# Patient Record
Sex: Female | Born: 1945
Health system: Southern US, Community
[De-identification: ages and names within clinical notes are randomized; demographics above are authoritative.]

## PROBLEM LIST (undated history)

## (undated) DIAGNOSIS — I10 Essential (primary) hypertension: Secondary | ICD-10-CM

## (undated) DIAGNOSIS — R609 Edema, unspecified: Secondary | ICD-10-CM

## (undated) DIAGNOSIS — M810 Age-related osteoporosis without current pathological fracture: Secondary | ICD-10-CM

## (undated) DIAGNOSIS — R000 Tachycardia, unspecified: Secondary | ICD-10-CM

## (undated) DIAGNOSIS — K5900 Constipation, unspecified: Secondary | ICD-10-CM

## (undated) DIAGNOSIS — E785 Hyperlipidemia, unspecified: Secondary | ICD-10-CM

## (undated) DIAGNOSIS — F329 Major depressive disorder, single episode, unspecified: Secondary | ICD-10-CM

## (undated) DIAGNOSIS — G5620 Lesion of ulnar nerve, unspecified upper limb: Secondary | ICD-10-CM

## (undated) DIAGNOSIS — M25551 Pain in right hip: Secondary | ICD-10-CM

## (undated) DIAGNOSIS — K219 Gastro-esophageal reflux disease without esophagitis: Secondary | ICD-10-CM

## (undated) DIAGNOSIS — F32A Depression, unspecified: Secondary | ICD-10-CM

## (undated) DIAGNOSIS — M109 Gout, unspecified: Secondary | ICD-10-CM

## (undated) DIAGNOSIS — I509 Heart failure, unspecified: Secondary | ICD-10-CM

## (undated) DIAGNOSIS — M25552 Pain in left hip: Secondary | ICD-10-CM

## (undated) DIAGNOSIS — E039 Hypothyroidism, unspecified: Secondary | ICD-10-CM

## (undated) DIAGNOSIS — L409 Psoriasis, unspecified: Secondary | ICD-10-CM

## (undated) DIAGNOSIS — I2699 Other pulmonary embolism without acute cor pulmonale: Secondary | ICD-10-CM

## (undated) DIAGNOSIS — F4321 Adjustment disorder with depressed mood: Secondary | ICD-10-CM

## (undated) DIAGNOSIS — Z87891 Personal history of nicotine dependence: Secondary | ICD-10-CM

## (undated) DIAGNOSIS — J45909 Unspecified asthma, uncomplicated: Secondary | ICD-10-CM

## (undated) HISTORY — DX: Gastro-esophageal reflux disease without esophagitis: K21.9

## (undated) HISTORY — DX: Adjustment disorder with depressed mood: F43.21

## (undated) HISTORY — DX: Tachycardia, unspecified: R00.0

## (undated) HISTORY — DX: Depression, unspecified: F32.A

## (undated) HISTORY — DX: Lesion of ulnar nerve, unspecified upper limb: G56.20

## (undated) HISTORY — DX: Pain in right hip: M25.551

## (undated) HISTORY — DX: Unspecified asthma, uncomplicated: J45.909

## (undated) HISTORY — DX: Pain in left hip: M25.552

## (undated) HISTORY — DX: Essential (primary) hypertension: I10

## (undated) HISTORY — DX: Constipation, unspecified: K59.00

## (undated) HISTORY — PX: NASAL SINUS SURGERY: SHX719

## (undated) HISTORY — DX: Edema, unspecified: R60.9

## (undated) HISTORY — PX: OTHER SURGICAL HISTORY: SHX169

## (undated) HISTORY — DX: Hypothyroidism, unspecified: E03.9

## (undated) HISTORY — DX: Gout, unspecified: M10.9

## (undated) HISTORY — DX: Other pulmonary embolism without acute cor pulmonale: I26.99

## (undated) HISTORY — DX: Hyperlipidemia, unspecified: E78.5

## (undated) HISTORY — DX: Personal history of nicotine dependence: Z87.891

## (undated) HISTORY — DX: Major depressive disorder, single episode, unspecified: F32.9

## (undated) HISTORY — DX: Heart failure, unspecified: I50.9

## (undated) HISTORY — DX: Age-related osteoporosis without current pathological fracture: M81.0

## (undated) HISTORY — DX: Psoriasis, unspecified: L40.9

---

## 2013-01-01 DIAGNOSIS — E785 Hyperlipidemia, unspecified: Secondary | ICD-10-CM | POA: Diagnosis not present

## 2013-01-01 DIAGNOSIS — F329 Major depressive disorder, single episode, unspecified: Secondary | ICD-10-CM | POA: Diagnosis not present

## 2013-01-01 DIAGNOSIS — E039 Hypothyroidism, unspecified: Secondary | ICD-10-CM | POA: Diagnosis not present

## 2013-01-01 DIAGNOSIS — F3289 Other specified depressive episodes: Secondary | ICD-10-CM | POA: Diagnosis not present

## 2013-01-01 DIAGNOSIS — J45909 Unspecified asthma, uncomplicated: Secondary | ICD-10-CM | POA: Diagnosis not present

## 2013-01-01 DIAGNOSIS — K219 Gastro-esophageal reflux disease without esophagitis: Secondary | ICD-10-CM | POA: Diagnosis not present

## 2013-01-01 DIAGNOSIS — Z7901 Long term (current) use of anticoagulants: Secondary | ICD-10-CM | POA: Diagnosis not present

## 2013-01-01 DIAGNOSIS — I10 Essential (primary) hypertension: Secondary | ICD-10-CM | POA: Diagnosis not present

## 2013-01-01 DIAGNOSIS — I82409 Acute embolism and thrombosis of unspecified deep veins of unspecified lower extremity: Secondary | ICD-10-CM | POA: Diagnosis not present

## 2013-01-01 DIAGNOSIS — Z79899 Other long term (current) drug therapy: Secondary | ICD-10-CM | POA: Diagnosis not present

## 2013-01-01 DIAGNOSIS — M810 Age-related osteoporosis without current pathological fracture: Secondary | ICD-10-CM | POA: Diagnosis not present

## 2013-01-01 DIAGNOSIS — R609 Edema, unspecified: Secondary | ICD-10-CM | POA: Diagnosis not present

## 2013-01-01 DIAGNOSIS — R0602 Shortness of breath: Secondary | ICD-10-CM | POA: Diagnosis not present

## 2013-01-04 DIAGNOSIS — H35379 Puckering of macula, unspecified eye: Secondary | ICD-10-CM | POA: Diagnosis not present

## 2013-01-04 DIAGNOSIS — H35359 Cystoid macular degeneration, unspecified eye: Secondary | ICD-10-CM | POA: Diagnosis not present

## 2013-02-02 DIAGNOSIS — I82409 Acute embolism and thrombosis of unspecified deep veins of unspecified lower extremity: Secondary | ICD-10-CM | POA: Diagnosis not present

## 2013-02-17 DIAGNOSIS — I82409 Acute embolism and thrombosis of unspecified deep veins of unspecified lower extremity: Secondary | ICD-10-CM | POA: Diagnosis not present

## 2013-02-25 DIAGNOSIS — J00 Acute nasopharyngitis [common cold]: Secondary | ICD-10-CM | POA: Diagnosis not present

## 2013-02-25 DIAGNOSIS — J209 Acute bronchitis, unspecified: Secondary | ICD-10-CM | POA: Diagnosis not present

## 2013-03-06 DIAGNOSIS — J069 Acute upper respiratory infection, unspecified: Secondary | ICD-10-CM | POA: Diagnosis not present

## 2013-03-06 DIAGNOSIS — J329 Chronic sinusitis, unspecified: Secondary | ICD-10-CM | POA: Diagnosis not present

## 2013-03-06 DIAGNOSIS — R05 Cough: Secondary | ICD-10-CM | POA: Diagnosis not present

## 2013-03-10 DIAGNOSIS — I82409 Acute embolism and thrombosis of unspecified deep veins of unspecified lower extremity: Secondary | ICD-10-CM | POA: Diagnosis not present

## 2013-03-10 DIAGNOSIS — I2699 Other pulmonary embolism without acute cor pulmonale: Secondary | ICD-10-CM | POA: Diagnosis not present

## 2013-03-10 DIAGNOSIS — M79609 Pain in unspecified limb: Secondary | ICD-10-CM | POA: Diagnosis not present

## 2013-03-10 DIAGNOSIS — J45909 Unspecified asthma, uncomplicated: Secondary | ICD-10-CM | POA: Diagnosis not present

## 2013-03-12 DIAGNOSIS — E785 Hyperlipidemia, unspecified: Secondary | ICD-10-CM | POA: Diagnosis not present

## 2013-03-12 DIAGNOSIS — R040 Epistaxis: Secondary | ICD-10-CM | POA: Diagnosis not present

## 2013-03-12 DIAGNOSIS — Z7901 Long term (current) use of anticoagulants: Secondary | ICD-10-CM | POA: Diagnosis not present

## 2013-03-12 DIAGNOSIS — D689 Coagulation defect, unspecified: Secondary | ICD-10-CM | POA: Diagnosis not present

## 2013-03-12 DIAGNOSIS — J329 Chronic sinusitis, unspecified: Secondary | ICD-10-CM | POA: Diagnosis not present

## 2013-03-12 DIAGNOSIS — K219 Gastro-esophageal reflux disease without esophagitis: Secondary | ICD-10-CM | POA: Diagnosis not present

## 2013-03-12 DIAGNOSIS — F329 Major depressive disorder, single episode, unspecified: Secondary | ICD-10-CM | POA: Diagnosis not present

## 2013-03-12 DIAGNOSIS — G44209 Tension-type headache, unspecified, not intractable: Secondary | ICD-10-CM | POA: Diagnosis not present

## 2013-03-12 DIAGNOSIS — Z7982 Long term (current) use of aspirin: Secondary | ICD-10-CM | POA: Diagnosis not present

## 2013-03-12 DIAGNOSIS — R51 Headache: Secondary | ICD-10-CM | POA: Diagnosis not present

## 2013-03-12 DIAGNOSIS — E039 Hypothyroidism, unspecified: Secondary | ICD-10-CM | POA: Diagnosis not present

## 2013-03-12 DIAGNOSIS — J45909 Unspecified asthma, uncomplicated: Secondary | ICD-10-CM | POA: Diagnosis not present

## 2013-03-12 DIAGNOSIS — I10 Essential (primary) hypertension: Secondary | ICD-10-CM | POA: Diagnosis not present

## 2013-03-12 DIAGNOSIS — I509 Heart failure, unspecified: Secondary | ICD-10-CM | POA: Diagnosis not present

## 2013-03-13 DIAGNOSIS — R040 Epistaxis: Secondary | ICD-10-CM | POA: Diagnosis not present

## 2013-03-13 DIAGNOSIS — E785 Hyperlipidemia, unspecified: Secondary | ICD-10-CM | POA: Diagnosis not present

## 2013-03-13 DIAGNOSIS — E039 Hypothyroidism, unspecified: Secondary | ICD-10-CM | POA: Diagnosis not present

## 2013-03-13 DIAGNOSIS — I10 Essential (primary) hypertension: Secondary | ICD-10-CM | POA: Diagnosis not present

## 2013-03-14 DIAGNOSIS — I10 Essential (primary) hypertension: Secondary | ICD-10-CM | POA: Diagnosis not present

## 2013-03-14 DIAGNOSIS — E039 Hypothyroidism, unspecified: Secondary | ICD-10-CM | POA: Diagnosis not present

## 2013-03-14 DIAGNOSIS — E785 Hyperlipidemia, unspecified: Secondary | ICD-10-CM | POA: Diagnosis not present

## 2013-03-14 DIAGNOSIS — R040 Epistaxis: Secondary | ICD-10-CM | POA: Diagnosis not present

## 2013-03-15 DIAGNOSIS — J45909 Unspecified asthma, uncomplicated: Secondary | ICD-10-CM | POA: Diagnosis not present

## 2013-03-15 DIAGNOSIS — R040 Epistaxis: Secondary | ICD-10-CM | POA: Diagnosis not present

## 2013-03-15 DIAGNOSIS — Z79899 Other long term (current) drug therapy: Secondary | ICD-10-CM | POA: Diagnosis not present

## 2013-03-15 DIAGNOSIS — I509 Heart failure, unspecified: Secondary | ICD-10-CM | POA: Diagnosis not present

## 2013-03-15 DIAGNOSIS — I82409 Acute embolism and thrombosis of unspecified deep veins of unspecified lower extremity: Secondary | ICD-10-CM | POA: Diagnosis not present

## 2013-03-15 DIAGNOSIS — I10 Essential (primary) hypertension: Secondary | ICD-10-CM | POA: Diagnosis not present

## 2013-03-16 DIAGNOSIS — R040 Epistaxis: Secondary | ICD-10-CM | POA: Diagnosis not present

## 2013-03-16 DIAGNOSIS — D689 Coagulation defect, unspecified: Secondary | ICD-10-CM | POA: Diagnosis not present

## 2013-03-16 DIAGNOSIS — J329 Chronic sinusitis, unspecified: Secondary | ICD-10-CM | POA: Diagnosis not present

## 2013-03-16 DIAGNOSIS — J310 Chronic rhinitis: Secondary | ICD-10-CM | POA: Diagnosis not present

## 2013-03-17 DIAGNOSIS — R040 Epistaxis: Secondary | ICD-10-CM | POA: Diagnosis not present

## 2013-03-23 DIAGNOSIS — R040 Epistaxis: Secondary | ICD-10-CM | POA: Diagnosis not present

## 2013-03-23 DIAGNOSIS — J342 Deviated nasal septum: Secondary | ICD-10-CM | POA: Diagnosis not present

## 2013-03-23 DIAGNOSIS — J329 Chronic sinusitis, unspecified: Secondary | ICD-10-CM | POA: Diagnosis not present

## 2013-03-23 DIAGNOSIS — D689 Coagulation defect, unspecified: Secondary | ICD-10-CM | POA: Diagnosis not present

## 2013-03-24 DIAGNOSIS — J329 Chronic sinusitis, unspecified: Secondary | ICD-10-CM | POA: Diagnosis not present

## 2013-03-24 DIAGNOSIS — J320 Chronic maxillary sinusitis: Secondary | ICD-10-CM | POA: Diagnosis not present

## 2013-03-31 DIAGNOSIS — R040 Epistaxis: Secondary | ICD-10-CM | POA: Diagnosis not present

## 2013-03-31 DIAGNOSIS — J320 Chronic maxillary sinusitis: Secondary | ICD-10-CM | POA: Diagnosis not present

## 2013-04-01 DIAGNOSIS — Z7901 Long term (current) use of anticoagulants: Secondary | ICD-10-CM | POA: Diagnosis not present

## 2013-04-01 DIAGNOSIS — J329 Chronic sinusitis, unspecified: Secondary | ICD-10-CM | POA: Diagnosis not present

## 2013-04-01 DIAGNOSIS — K449 Diaphragmatic hernia without obstruction or gangrene: Secondary | ICD-10-CM | POA: Diagnosis not present

## 2013-04-01 DIAGNOSIS — R05 Cough: Secondary | ICD-10-CM | POA: Diagnosis not present

## 2013-04-01 DIAGNOSIS — I2699 Other pulmonary embolism without acute cor pulmonale: Secondary | ICD-10-CM | POA: Diagnosis not present

## 2013-04-01 DIAGNOSIS — R040 Epistaxis: Secondary | ICD-10-CM | POA: Diagnosis not present

## 2013-04-01 DIAGNOSIS — Z79899 Other long term (current) drug therapy: Secondary | ICD-10-CM | POA: Diagnosis not present

## 2013-04-19 DIAGNOSIS — J329 Chronic sinusitis, unspecified: Secondary | ICD-10-CM | POA: Diagnosis not present

## 2013-04-19 DIAGNOSIS — I509 Heart failure, unspecified: Secondary | ICD-10-CM | POA: Diagnosis not present

## 2013-04-19 DIAGNOSIS — Z79899 Other long term (current) drug therapy: Secondary | ICD-10-CM | POA: Diagnosis not present

## 2013-04-19 DIAGNOSIS — Z01818 Encounter for other preprocedural examination: Secondary | ICD-10-CM | POA: Diagnosis not present

## 2013-04-29 DIAGNOSIS — J329 Chronic sinusitis, unspecified: Secondary | ICD-10-CM | POA: Diagnosis not present

## 2013-04-29 DIAGNOSIS — Z79899 Other long term (current) drug therapy: Secondary | ICD-10-CM | POA: Diagnosis not present

## 2013-04-29 DIAGNOSIS — I509 Heart failure, unspecified: Secondary | ICD-10-CM | POA: Diagnosis not present

## 2013-04-29 DIAGNOSIS — J310 Chronic rhinitis: Secondary | ICD-10-CM | POA: Diagnosis not present

## 2013-05-10 DIAGNOSIS — J329 Chronic sinusitis, unspecified: Secondary | ICD-10-CM | POA: Diagnosis not present

## 2013-05-18 DIAGNOSIS — J329 Chronic sinusitis, unspecified: Secondary | ICD-10-CM | POA: Diagnosis not present

## 2013-05-18 DIAGNOSIS — J310 Chronic rhinitis: Secondary | ICD-10-CM | POA: Diagnosis not present

## 2013-05-18 DIAGNOSIS — J0120 Acute ethmoidal sinusitis, unspecified: Secondary | ICD-10-CM | POA: Diagnosis not present

## 2013-05-18 DIAGNOSIS — J0100 Acute maxillary sinusitis, unspecified: Secondary | ICD-10-CM | POA: Diagnosis not present

## 2013-06-07 DIAGNOSIS — I2699 Other pulmonary embolism without acute cor pulmonale: Secondary | ICD-10-CM | POA: Diagnosis not present

## 2013-06-07 DIAGNOSIS — K219 Gastro-esophageal reflux disease without esophagitis: Secondary | ICD-10-CM | POA: Diagnosis not present

## 2013-06-07 DIAGNOSIS — E039 Hypothyroidism, unspecified: Secondary | ICD-10-CM | POA: Diagnosis not present

## 2013-06-07 DIAGNOSIS — I82409 Acute embolism and thrombosis of unspecified deep veins of unspecified lower extremity: Secondary | ICD-10-CM | POA: Diagnosis not present

## 2013-06-07 DIAGNOSIS — F329 Major depressive disorder, single episode, unspecified: Secondary | ICD-10-CM | POA: Diagnosis not present

## 2013-06-07 DIAGNOSIS — I10 Essential (primary) hypertension: Secondary | ICD-10-CM | POA: Diagnosis not present

## 2013-06-07 DIAGNOSIS — G4700 Insomnia, unspecified: Secondary | ICD-10-CM | POA: Diagnosis not present

## 2013-06-10 DIAGNOSIS — I2699 Other pulmonary embolism without acute cor pulmonale: Secondary | ICD-10-CM | POA: Diagnosis not present

## 2013-06-10 DIAGNOSIS — I82409 Acute embolism and thrombosis of unspecified deep veins of unspecified lower extremity: Secondary | ICD-10-CM | POA: Diagnosis not present

## 2013-06-14 DIAGNOSIS — Z0389 Encounter for observation for other suspected diseases and conditions ruled out: Secondary | ICD-10-CM | POA: Diagnosis not present

## 2013-06-14 DIAGNOSIS — I82409 Acute embolism and thrombosis of unspecified deep veins of unspecified lower extremity: Secondary | ICD-10-CM | POA: Diagnosis not present

## 2013-06-21 DIAGNOSIS — I10 Essential (primary) hypertension: Secondary | ICD-10-CM | POA: Diagnosis not present

## 2013-06-21 DIAGNOSIS — E039 Hypothyroidism, unspecified: Secondary | ICD-10-CM | POA: Diagnosis not present

## 2013-06-21 DIAGNOSIS — M5137 Other intervertebral disc degeneration, lumbosacral region: Secondary | ICD-10-CM | POA: Diagnosis not present

## 2013-06-21 DIAGNOSIS — M1710 Unilateral primary osteoarthritis, unspecified knee: Secondary | ICD-10-CM | POA: Diagnosis not present

## 2013-06-21 DIAGNOSIS — M25569 Pain in unspecified knee: Secondary | ICD-10-CM | POA: Diagnosis not present

## 2013-06-21 DIAGNOSIS — M47817 Spondylosis without myelopathy or radiculopathy, lumbosacral region: Secondary | ICD-10-CM | POA: Diagnosis not present

## 2013-06-21 DIAGNOSIS — M79609 Pain in unspecified limb: Secondary | ICD-10-CM | POA: Diagnosis not present

## 2013-06-21 DIAGNOSIS — K219 Gastro-esophageal reflux disease without esophagitis: Secondary | ICD-10-CM | POA: Diagnosis not present

## 2013-06-21 DIAGNOSIS — M545 Low back pain: Secondary | ICD-10-CM | POA: Diagnosis not present

## 2013-06-21 DIAGNOSIS — M25559 Pain in unspecified hip: Secondary | ICD-10-CM | POA: Diagnosis not present

## 2013-06-21 DIAGNOSIS — F329 Major depressive disorder, single episode, unspecified: Secondary | ICD-10-CM | POA: Diagnosis not present

## 2013-06-21 DIAGNOSIS — IMO0002 Reserved for concepts with insufficient information to code with codable children: Secondary | ICD-10-CM | POA: Diagnosis not present

## 2013-06-23 DIAGNOSIS — I2699 Other pulmonary embolism without acute cor pulmonale: Secondary | ICD-10-CM | POA: Diagnosis not present

## 2013-06-24 DIAGNOSIS — M2550 Pain in unspecified joint: Secondary | ICD-10-CM | POA: Diagnosis not present

## 2013-06-24 DIAGNOSIS — K117 Disturbances of salivary secretion: Secondary | ICD-10-CM | POA: Diagnosis not present

## 2013-06-24 DIAGNOSIS — R5383 Other fatigue: Secondary | ICD-10-CM | POA: Diagnosis not present

## 2013-06-24 DIAGNOSIS — IMO0001 Reserved for inherently not codable concepts without codable children: Secondary | ICD-10-CM | POA: Diagnosis not present

## 2013-06-24 DIAGNOSIS — R5381 Other malaise: Secondary | ICD-10-CM | POA: Diagnosis not present

## 2013-07-01 DIAGNOSIS — I739 Peripheral vascular disease, unspecified: Secondary | ICD-10-CM | POA: Diagnosis not present

## 2013-07-01 DIAGNOSIS — I743 Embolism and thrombosis of arteries of the lower extremities: Secondary | ICD-10-CM | POA: Diagnosis not present

## 2013-07-05 DIAGNOSIS — H35379 Puckering of macula, unspecified eye: Secondary | ICD-10-CM | POA: Diagnosis not present

## 2013-07-05 DIAGNOSIS — H35359 Cystoid macular degeneration, unspecified eye: Secondary | ICD-10-CM | POA: Diagnosis not present

## 2013-07-16 DIAGNOSIS — M2550 Pain in unspecified joint: Secondary | ICD-10-CM | POA: Diagnosis not present

## 2013-07-16 DIAGNOSIS — IMO0001 Reserved for inherently not codable concepts without codable children: Secondary | ICD-10-CM | POA: Diagnosis not present

## 2013-07-16 DIAGNOSIS — R5381 Other malaise: Secondary | ICD-10-CM | POA: Diagnosis not present

## 2013-07-16 DIAGNOSIS — R700 Elevated erythrocyte sedimentation rate: Secondary | ICD-10-CM | POA: Diagnosis not present

## 2013-07-21 DIAGNOSIS — M2550 Pain in unspecified joint: Secondary | ICD-10-CM | POA: Diagnosis not present

## 2013-07-21 DIAGNOSIS — IMO0001 Reserved for inherently not codable concepts without codable children: Secondary | ICD-10-CM | POA: Diagnosis not present

## 2013-07-21 DIAGNOSIS — I2699 Other pulmonary embolism without acute cor pulmonale: Secondary | ICD-10-CM | POA: Diagnosis not present

## 2013-07-21 DIAGNOSIS — R700 Elevated erythrocyte sedimentation rate: Secondary | ICD-10-CM | POA: Diagnosis not present

## 2013-08-02 DIAGNOSIS — I10 Essential (primary) hypertension: Secondary | ICD-10-CM | POA: Diagnosis not present

## 2013-08-02 DIAGNOSIS — E039 Hypothyroidism, unspecified: Secondary | ICD-10-CM | POA: Diagnosis not present

## 2013-08-02 DIAGNOSIS — F329 Major depressive disorder, single episode, unspecified: Secondary | ICD-10-CM | POA: Diagnosis not present

## 2013-08-02 DIAGNOSIS — K219 Gastro-esophageal reflux disease without esophagitis: Secondary | ICD-10-CM | POA: Diagnosis not present

## 2013-08-02 DIAGNOSIS — Z79899 Other long term (current) drug therapy: Secondary | ICD-10-CM | POA: Diagnosis not present

## 2013-08-02 DIAGNOSIS — E785 Hyperlipidemia, unspecified: Secondary | ICD-10-CM | POA: Diagnosis not present

## 2013-08-02 DIAGNOSIS — I82409 Acute embolism and thrombosis of unspecified deep veins of unspecified lower extremity: Secondary | ICD-10-CM | POA: Diagnosis not present

## 2013-08-02 DIAGNOSIS — I509 Heart failure, unspecified: Secondary | ICD-10-CM | POA: Diagnosis not present

## 2013-08-16 DIAGNOSIS — E119 Type 2 diabetes mellitus without complications: Secondary | ICD-10-CM | POA: Diagnosis not present

## 2013-08-16 DIAGNOSIS — Z79899 Other long term (current) drug therapy: Secondary | ICD-10-CM | POA: Diagnosis not present

## 2013-08-16 DIAGNOSIS — E039 Hypothyroidism, unspecified: Secondary | ICD-10-CM | POA: Diagnosis not present

## 2013-08-16 DIAGNOSIS — I10 Essential (primary) hypertension: Secondary | ICD-10-CM | POA: Diagnosis not present

## 2013-08-16 DIAGNOSIS — I82409 Acute embolism and thrombosis of unspecified deep veins of unspecified lower extremity: Secondary | ICD-10-CM | POA: Diagnosis not present

## 2013-08-16 DIAGNOSIS — E559 Vitamin D deficiency, unspecified: Secondary | ICD-10-CM | POA: Diagnosis not present

## 2013-08-16 DIAGNOSIS — D509 Iron deficiency anemia, unspecified: Secondary | ICD-10-CM | POA: Diagnosis not present

## 2013-08-16 DIAGNOSIS — E782 Mixed hyperlipidemia: Secondary | ICD-10-CM | POA: Diagnosis not present

## 2013-08-16 DIAGNOSIS — M25569 Pain in unspecified knee: Secondary | ICD-10-CM | POA: Diagnosis not present

## 2013-08-24 DIAGNOSIS — L218 Other seborrheic dermatitis: Secondary | ICD-10-CM | POA: Diagnosis not present

## 2013-08-24 DIAGNOSIS — R609 Edema, unspecified: Secondary | ICD-10-CM | POA: Diagnosis not present

## 2013-08-24 DIAGNOSIS — I509 Heart failure, unspecified: Secondary | ICD-10-CM | POA: Diagnosis not present

## 2013-08-31 DIAGNOSIS — I509 Heart failure, unspecified: Secondary | ICD-10-CM | POA: Diagnosis not present

## 2013-08-31 DIAGNOSIS — Z7901 Long term (current) use of anticoagulants: Secondary | ICD-10-CM | POA: Diagnosis not present

## 2013-09-06 DIAGNOSIS — L570 Actinic keratosis: Secondary | ICD-10-CM | POA: Diagnosis not present

## 2013-09-06 DIAGNOSIS — L578 Other skin changes due to chronic exposure to nonionizing radiation: Secondary | ICD-10-CM | POA: Diagnosis not present

## 2013-09-25 DIAGNOSIS — R040 Epistaxis: Secondary | ICD-10-CM | POA: Diagnosis not present

## 2013-09-25 DIAGNOSIS — E7800 Pure hypercholesterolemia, unspecified: Secondary | ICD-10-CM | POA: Diagnosis not present

## 2013-09-25 DIAGNOSIS — Z79899 Other long term (current) drug therapy: Secondary | ICD-10-CM | POA: Diagnosis not present

## 2013-09-25 DIAGNOSIS — J45909 Unspecified asthma, uncomplicated: Secondary | ICD-10-CM | POA: Diagnosis not present

## 2013-09-25 DIAGNOSIS — D689 Coagulation defect, unspecified: Secondary | ICD-10-CM | POA: Diagnosis not present

## 2013-09-25 DIAGNOSIS — Z7901 Long term (current) use of anticoagulants: Secondary | ICD-10-CM | POA: Diagnosis not present

## 2013-09-25 DIAGNOSIS — I10 Essential (primary) hypertension: Secondary | ICD-10-CM | POA: Diagnosis not present

## 2013-09-25 DIAGNOSIS — K219 Gastro-esophageal reflux disease without esophagitis: Secondary | ICD-10-CM | POA: Diagnosis not present

## 2013-09-30 DIAGNOSIS — M25569 Pain in unspecified knee: Secondary | ICD-10-CM | POA: Diagnosis not present

## 2013-09-30 DIAGNOSIS — Z7901 Long term (current) use of anticoagulants: Secondary | ICD-10-CM | POA: Diagnosis not present

## 2013-09-30 DIAGNOSIS — Z23 Encounter for immunization: Secondary | ICD-10-CM | POA: Diagnosis not present

## 2013-09-30 DIAGNOSIS — I82409 Acute embolism and thrombosis of unspecified deep veins of unspecified lower extremity: Secondary | ICD-10-CM | POA: Diagnosis not present

## 2013-09-30 DIAGNOSIS — E039 Hypothyroidism, unspecified: Secondary | ICD-10-CM | POA: Diagnosis not present

## 2013-09-30 DIAGNOSIS — N19 Unspecified kidney failure: Secondary | ICD-10-CM | POA: Diagnosis not present

## 2013-10-07 DIAGNOSIS — Z7901 Long term (current) use of anticoagulants: Secondary | ICD-10-CM | POA: Diagnosis not present

## 2013-10-07 DIAGNOSIS — I82409 Acute embolism and thrombosis of unspecified deep veins of unspecified lower extremity: Secondary | ICD-10-CM | POA: Diagnosis not present

## 2013-10-13 DIAGNOSIS — I82409 Acute embolism and thrombosis of unspecified deep veins of unspecified lower extremity: Secondary | ICD-10-CM | POA: Diagnosis not present

## 2013-10-13 DIAGNOSIS — Z7901 Long term (current) use of anticoagulants: Secondary | ICD-10-CM | POA: Diagnosis not present

## 2013-10-18 DIAGNOSIS — L821 Other seborrheic keratosis: Secondary | ICD-10-CM | POA: Diagnosis not present

## 2013-10-18 DIAGNOSIS — L570 Actinic keratosis: Secondary | ICD-10-CM | POA: Diagnosis not present

## 2013-10-18 DIAGNOSIS — L820 Inflamed seborrheic keratosis: Secondary | ICD-10-CM | POA: Diagnosis not present

## 2013-10-27 DIAGNOSIS — Z7901 Long term (current) use of anticoagulants: Secondary | ICD-10-CM | POA: Diagnosis not present

## 2013-10-27 DIAGNOSIS — I82409 Acute embolism and thrombosis of unspecified deep veins of unspecified lower extremity: Secondary | ICD-10-CM | POA: Diagnosis not present

## 2013-11-09 DIAGNOSIS — I2699 Other pulmonary embolism without acute cor pulmonale: Secondary | ICD-10-CM | POA: Diagnosis not present

## 2013-11-09 DIAGNOSIS — I82409 Acute embolism and thrombosis of unspecified deep veins of unspecified lower extremity: Secondary | ICD-10-CM | POA: Diagnosis not present

## 2013-12-01 DIAGNOSIS — I82409 Acute embolism and thrombosis of unspecified deep veins of unspecified lower extremity: Secondary | ICD-10-CM | POA: Diagnosis not present

## 2013-12-01 DIAGNOSIS — I2699 Other pulmonary embolism without acute cor pulmonale: Secondary | ICD-10-CM | POA: Diagnosis not present

## 2013-12-13 DIAGNOSIS — Z7901 Long term (current) use of anticoagulants: Secondary | ICD-10-CM | POA: Diagnosis not present

## 2013-12-13 DIAGNOSIS — I82409 Acute embolism and thrombosis of unspecified deep veins of unspecified lower extremity: Secondary | ICD-10-CM | POA: Diagnosis not present

## 2013-12-13 DIAGNOSIS — J209 Acute bronchitis, unspecified: Secondary | ICD-10-CM | POA: Diagnosis not present

## 2014-01-04 DIAGNOSIS — I82409 Acute embolism and thrombosis of unspecified deep veins of unspecified lower extremity: Secondary | ICD-10-CM | POA: Diagnosis not present

## 2014-01-04 DIAGNOSIS — I2699 Other pulmonary embolism without acute cor pulmonale: Secondary | ICD-10-CM | POA: Diagnosis not present

## 2014-02-01 DIAGNOSIS — I82409 Acute embolism and thrombosis of unspecified deep veins of unspecified lower extremity: Secondary | ICD-10-CM | POA: Diagnosis not present

## 2014-02-01 DIAGNOSIS — I2699 Other pulmonary embolism without acute cor pulmonale: Secondary | ICD-10-CM | POA: Diagnosis not present

## 2014-02-18 DIAGNOSIS — L219 Seborrheic dermatitis, unspecified: Secondary | ICD-10-CM | POA: Diagnosis not present

## 2014-02-18 DIAGNOSIS — L659 Nonscarring hair loss, unspecified: Secondary | ICD-10-CM | POA: Diagnosis not present

## 2014-02-18 DIAGNOSIS — L570 Actinic keratosis: Secondary | ICD-10-CM | POA: Diagnosis not present

## 2014-03-01 DIAGNOSIS — I82409 Acute embolism and thrombosis of unspecified deep veins of unspecified lower extremity: Secondary | ICD-10-CM | POA: Diagnosis not present

## 2014-03-01 DIAGNOSIS — I2699 Other pulmonary embolism without acute cor pulmonale: Secondary | ICD-10-CM | POA: Diagnosis not present

## 2014-03-18 DIAGNOSIS — Z7901 Long term (current) use of anticoagulants: Secondary | ICD-10-CM | POA: Diagnosis not present

## 2014-03-18 DIAGNOSIS — F411 Generalized anxiety disorder: Secondary | ICD-10-CM | POA: Diagnosis not present

## 2014-03-18 DIAGNOSIS — I10 Essential (primary) hypertension: Secondary | ICD-10-CM | POA: Diagnosis not present

## 2014-03-18 DIAGNOSIS — I82409 Acute embolism and thrombosis of unspecified deep veins of unspecified lower extremity: Secondary | ICD-10-CM | POA: Diagnosis not present

## 2014-03-24 DIAGNOSIS — Z1231 Encounter for screening mammogram for malignant neoplasm of breast: Secondary | ICD-10-CM | POA: Diagnosis not present

## 2014-03-31 DIAGNOSIS — I82409 Acute embolism and thrombosis of unspecified deep veins of unspecified lower extremity: Secondary | ICD-10-CM | POA: Diagnosis not present

## 2014-03-31 DIAGNOSIS — I2699 Other pulmonary embolism without acute cor pulmonale: Secondary | ICD-10-CM | POA: Diagnosis not present

## 2014-04-27 DIAGNOSIS — I82409 Acute embolism and thrombosis of unspecified deep veins of unspecified lower extremity: Secondary | ICD-10-CM | POA: Diagnosis not present

## 2014-04-27 DIAGNOSIS — I2699 Other pulmonary embolism without acute cor pulmonale: Secondary | ICD-10-CM | POA: Diagnosis not present

## 2014-05-25 DIAGNOSIS — I82409 Acute embolism and thrombosis of unspecified deep veins of unspecified lower extremity: Secondary | ICD-10-CM | POA: Diagnosis not present

## 2014-05-25 DIAGNOSIS — I2699 Other pulmonary embolism without acute cor pulmonale: Secondary | ICD-10-CM | POA: Diagnosis not present

## 2014-06-20 DIAGNOSIS — L821 Other seborrheic keratosis: Secondary | ICD-10-CM | POA: Diagnosis not present

## 2014-06-20 DIAGNOSIS — L570 Actinic keratosis: Secondary | ICD-10-CM | POA: Diagnosis not present

## 2014-06-20 DIAGNOSIS — L820 Inflamed seborrheic keratosis: Secondary | ICD-10-CM | POA: Diagnosis not present

## 2014-06-21 DIAGNOSIS — I82409 Acute embolism and thrombosis of unspecified deep veins of unspecified lower extremity: Secondary | ICD-10-CM | POA: Diagnosis not present

## 2014-06-21 DIAGNOSIS — I2699 Other pulmonary embolism without acute cor pulmonale: Secondary | ICD-10-CM | POA: Diagnosis not present

## 2014-07-18 DIAGNOSIS — E782 Mixed hyperlipidemia: Secondary | ICD-10-CM | POA: Diagnosis not present

## 2014-07-18 DIAGNOSIS — M2550 Pain in unspecified joint: Secondary | ICD-10-CM | POA: Diagnosis not present

## 2014-07-18 DIAGNOSIS — E559 Vitamin D deficiency, unspecified: Secondary | ICD-10-CM | POA: Diagnosis not present

## 2014-07-18 DIAGNOSIS — Z7901 Long term (current) use of anticoagulants: Secondary | ICD-10-CM | POA: Diagnosis not present

## 2014-07-18 DIAGNOSIS — F411 Generalized anxiety disorder: Secondary | ICD-10-CM | POA: Diagnosis not present

## 2014-07-18 DIAGNOSIS — L408 Other psoriasis: Secondary | ICD-10-CM | POA: Diagnosis not present

## 2014-07-18 DIAGNOSIS — E119 Type 2 diabetes mellitus without complications: Secondary | ICD-10-CM | POA: Diagnosis not present

## 2014-07-18 DIAGNOSIS — I10 Essential (primary) hypertension: Secondary | ICD-10-CM | POA: Diagnosis not present

## 2014-07-18 DIAGNOSIS — I82409 Acute embolism and thrombosis of unspecified deep veins of unspecified lower extremity: Secondary | ICD-10-CM | POA: Diagnosis not present

## 2014-07-18 DIAGNOSIS — E039 Hypothyroidism, unspecified: Secondary | ICD-10-CM | POA: Diagnosis not present

## 2014-07-20 DIAGNOSIS — I82409 Acute embolism and thrombosis of unspecified deep veins of unspecified lower extremity: Secondary | ICD-10-CM | POA: Diagnosis not present

## 2014-07-20 DIAGNOSIS — I2699 Other pulmonary embolism without acute cor pulmonale: Secondary | ICD-10-CM | POA: Diagnosis not present

## 2014-08-15 DIAGNOSIS — Z79899 Other long term (current) drug therapy: Secondary | ICD-10-CM | POA: Diagnosis not present

## 2014-08-15 DIAGNOSIS — M545 Low back pain, unspecified: Secondary | ICD-10-CM | POA: Diagnosis not present

## 2014-08-16 DIAGNOSIS — M79609 Pain in unspecified limb: Secondary | ICD-10-CM | POA: Diagnosis not present

## 2014-08-16 DIAGNOSIS — I10 Essential (primary) hypertension: Secondary | ICD-10-CM | POA: Diagnosis not present

## 2014-08-16 DIAGNOSIS — M25579 Pain in unspecified ankle and joints of unspecified foot: Secondary | ICD-10-CM | POA: Diagnosis not present

## 2014-08-19 DIAGNOSIS — I2699 Other pulmonary embolism without acute cor pulmonale: Secondary | ICD-10-CM | POA: Diagnosis not present

## 2014-08-19 DIAGNOSIS — I82409 Acute embolism and thrombosis of unspecified deep veins of unspecified lower extremity: Secondary | ICD-10-CM | POA: Diagnosis not present

## 2014-08-25 DIAGNOSIS — M659 Synovitis and tenosynovitis, unspecified: Secondary | ICD-10-CM | POA: Diagnosis not present

## 2014-08-25 DIAGNOSIS — M7750 Other enthesopathy of unspecified foot: Secondary | ICD-10-CM | POA: Diagnosis not present

## 2014-09-15 DIAGNOSIS — M659 Synovitis and tenosynovitis, unspecified: Secondary | ICD-10-CM | POA: Diagnosis not present

## 2014-09-15 DIAGNOSIS — M7750 Other enthesopathy of unspecified foot: Secondary | ICD-10-CM | POA: Diagnosis not present

## 2014-09-17 DIAGNOSIS — I2699 Other pulmonary embolism without acute cor pulmonale: Secondary | ICD-10-CM | POA: Diagnosis not present

## 2014-09-17 DIAGNOSIS — I82409 Acute embolism and thrombosis of unspecified deep veins of unspecified lower extremity: Secondary | ICD-10-CM | POA: Diagnosis not present

## 2014-10-12 DIAGNOSIS — R252 Cramp and spasm: Secondary | ICD-10-CM | POA: Diagnosis not present

## 2014-10-12 DIAGNOSIS — L4050 Arthropathic psoriasis, unspecified: Secondary | ICD-10-CM | POA: Diagnosis not present

## 2014-10-12 DIAGNOSIS — I82419 Acute embolism and thrombosis of unspecified femoral vein: Secondary | ICD-10-CM | POA: Diagnosis not present

## 2014-10-12 DIAGNOSIS — E782 Mixed hyperlipidemia: Secondary | ICD-10-CM | POA: Diagnosis not present

## 2014-10-12 DIAGNOSIS — Z7901 Long term (current) use of anticoagulants: Secondary | ICD-10-CM | POA: Diagnosis not present

## 2014-10-12 DIAGNOSIS — E039 Hypothyroidism, unspecified: Secondary | ICD-10-CM | POA: Diagnosis not present

## 2014-10-12 DIAGNOSIS — E559 Vitamin D deficiency, unspecified: Secondary | ICD-10-CM | POA: Diagnosis not present

## 2014-10-12 DIAGNOSIS — E119 Type 2 diabetes mellitus without complications: Secondary | ICD-10-CM | POA: Diagnosis not present

## 2014-10-12 DIAGNOSIS — K219 Gastro-esophageal reflux disease without esophagitis: Secondary | ICD-10-CM | POA: Diagnosis not present

## 2014-10-12 DIAGNOSIS — Z23 Encounter for immunization: Secondary | ICD-10-CM | POA: Diagnosis not present

## 2014-10-12 DIAGNOSIS — I10 Essential (primary) hypertension: Secondary | ICD-10-CM | POA: Diagnosis not present

## 2014-10-12 DIAGNOSIS — F411 Generalized anxiety disorder: Secondary | ICD-10-CM | POA: Diagnosis not present

## 2014-10-12 DIAGNOSIS — M2550 Pain in unspecified joint: Secondary | ICD-10-CM | POA: Diagnosis not present

## 2014-10-17 DIAGNOSIS — I82409 Acute embolism and thrombosis of unspecified deep veins of unspecified lower extremity: Secondary | ICD-10-CM | POA: Diagnosis not present

## 2014-10-17 DIAGNOSIS — I2699 Other pulmonary embolism without acute cor pulmonale: Secondary | ICD-10-CM | POA: Diagnosis not present

## 2014-10-20 DIAGNOSIS — R351 Nocturia: Secondary | ICD-10-CM | POA: Diagnosis not present

## 2014-10-20 DIAGNOSIS — N3281 Overactive bladder: Secondary | ICD-10-CM | POA: Diagnosis not present

## 2014-10-20 DIAGNOSIS — R3912 Poor urinary stream: Secondary | ICD-10-CM | POA: Diagnosis not present

## 2014-10-20 DIAGNOSIS — N318 Other neuromuscular dysfunction of bladder: Secondary | ICD-10-CM | POA: Diagnosis not present

## 2014-10-20 DIAGNOSIS — R3914 Feeling of incomplete bladder emptying: Secondary | ICD-10-CM | POA: Diagnosis not present

## 2014-10-20 DIAGNOSIS — N3090 Cystitis, unspecified without hematuria: Secondary | ICD-10-CM | POA: Diagnosis not present

## 2014-11-04 DIAGNOSIS — N3281 Overactive bladder: Secondary | ICD-10-CM | POA: Diagnosis not present

## 2014-11-04 DIAGNOSIS — N318 Other neuromuscular dysfunction of bladder: Secondary | ICD-10-CM | POA: Diagnosis not present

## 2014-11-04 DIAGNOSIS — R351 Nocturia: Secondary | ICD-10-CM | POA: Diagnosis not present

## 2014-11-04 DIAGNOSIS — N3090 Cystitis, unspecified without hematuria: Secondary | ICD-10-CM | POA: Diagnosis not present

## 2014-11-04 DIAGNOSIS — R3914 Feeling of incomplete bladder emptying: Secondary | ICD-10-CM | POA: Diagnosis not present

## 2014-11-13 DIAGNOSIS — I2699 Other pulmonary embolism without acute cor pulmonale: Secondary | ICD-10-CM | POA: Diagnosis not present

## 2014-11-13 DIAGNOSIS — I82409 Acute embolism and thrombosis of unspecified deep veins of unspecified lower extremity: Secondary | ICD-10-CM | POA: Diagnosis not present

## 2014-12-12 DIAGNOSIS — I2699 Other pulmonary embolism without acute cor pulmonale: Secondary | ICD-10-CM | POA: Diagnosis not present

## 2014-12-12 DIAGNOSIS — I82409 Acute embolism and thrombosis of unspecified deep veins of unspecified lower extremity: Secondary | ICD-10-CM | POA: Diagnosis not present

## 2015-01-10 DIAGNOSIS — I2699 Other pulmonary embolism without acute cor pulmonale: Secondary | ICD-10-CM | POA: Diagnosis not present

## 2015-02-07 DIAGNOSIS — I2699 Other pulmonary embolism without acute cor pulmonale: Secondary | ICD-10-CM | POA: Diagnosis not present

## 2015-02-16 DIAGNOSIS — E119 Type 2 diabetes mellitus without complications: Secondary | ICD-10-CM | POA: Diagnosis not present

## 2015-02-16 DIAGNOSIS — Z7901 Long term (current) use of anticoagulants: Secondary | ICD-10-CM | POA: Diagnosis not present

## 2015-02-16 DIAGNOSIS — I10 Essential (primary) hypertension: Secondary | ICD-10-CM | POA: Diagnosis not present

## 2015-02-16 DIAGNOSIS — E559 Vitamin D deficiency, unspecified: Secondary | ICD-10-CM | POA: Diagnosis not present

## 2015-02-16 DIAGNOSIS — M2550 Pain in unspecified joint: Secondary | ICD-10-CM | POA: Diagnosis not present

## 2015-02-16 DIAGNOSIS — K219 Gastro-esophageal reflux disease without esophagitis: Secondary | ICD-10-CM | POA: Diagnosis not present

## 2015-02-16 DIAGNOSIS — E782 Mixed hyperlipidemia: Secondary | ICD-10-CM | POA: Diagnosis not present

## 2015-02-16 DIAGNOSIS — E039 Hypothyroidism, unspecified: Secondary | ICD-10-CM | POA: Diagnosis not present

## 2015-02-16 DIAGNOSIS — R252 Cramp and spasm: Secondary | ICD-10-CM | POA: Diagnosis not present

## 2015-02-16 DIAGNOSIS — F328 Other depressive episodes: Secondary | ICD-10-CM | POA: Diagnosis not present

## 2015-02-16 DIAGNOSIS — I824Y9 Acute embolism and thrombosis of unspecified deep veins of unspecified proximal lower extremity: Secondary | ICD-10-CM | POA: Diagnosis not present

## 2015-02-16 DIAGNOSIS — L4050 Arthropathic psoriasis, unspecified: Secondary | ICD-10-CM | POA: Diagnosis not present

## 2015-02-16 DIAGNOSIS — F411 Generalized anxiety disorder: Secondary | ICD-10-CM | POA: Diagnosis not present

## 2015-02-20 DIAGNOSIS — J45909 Unspecified asthma, uncomplicated: Secondary | ICD-10-CM | POA: Diagnosis not present

## 2015-02-20 DIAGNOSIS — R42 Dizziness and giddiness: Secondary | ICD-10-CM | POA: Diagnosis not present

## 2015-02-20 DIAGNOSIS — R079 Chest pain, unspecified: Secondary | ICD-10-CM | POA: Diagnosis not present

## 2015-02-20 DIAGNOSIS — Z7901 Long term (current) use of anticoagulants: Secondary | ICD-10-CM | POA: Diagnosis not present

## 2015-02-20 DIAGNOSIS — E780 Pure hypercholesterolemia: Secondary | ICD-10-CM | POA: Diagnosis not present

## 2015-02-20 DIAGNOSIS — I959 Hypotension, unspecified: Secondary | ICD-10-CM | POA: Diagnosis not present

## 2015-02-20 DIAGNOSIS — Z7982 Long term (current) use of aspirin: Secondary | ICD-10-CM | POA: Diagnosis not present

## 2015-02-23 DIAGNOSIS — R079 Chest pain, unspecified: Secondary | ICD-10-CM | POA: Diagnosis not present

## 2015-02-23 DIAGNOSIS — I951 Orthostatic hypotension: Secondary | ICD-10-CM | POA: Diagnosis not present

## 2015-02-26 DIAGNOSIS — E782 Mixed hyperlipidemia: Secondary | ICD-10-CM | POA: Diagnosis not present

## 2015-02-26 DIAGNOSIS — I509 Heart failure, unspecified: Secondary | ICD-10-CM | POA: Diagnosis not present

## 2015-02-26 DIAGNOSIS — J45909 Unspecified asthma, uncomplicated: Secondary | ICD-10-CM | POA: Diagnosis not present

## 2015-02-26 DIAGNOSIS — I959 Hypotension, unspecified: Secondary | ICD-10-CM | POA: Diagnosis not present

## 2015-02-26 DIAGNOSIS — M6281 Muscle weakness (generalized): Secondary | ICD-10-CM | POA: Diagnosis not present

## 2015-02-26 DIAGNOSIS — M797 Fibromyalgia: Secondary | ICD-10-CM | POA: Diagnosis not present

## 2015-02-26 DIAGNOSIS — F328 Other depressive episodes: Secondary | ICD-10-CM | POA: Diagnosis not present

## 2015-02-26 DIAGNOSIS — Z7901 Long term (current) use of anticoagulants: Secondary | ICD-10-CM | POA: Diagnosis not present

## 2015-02-26 DIAGNOSIS — E039 Hypothyroidism, unspecified: Secondary | ICD-10-CM | POA: Diagnosis not present

## 2015-02-26 DIAGNOSIS — F411 Generalized anxiety disorder: Secondary | ICD-10-CM | POA: Diagnosis not present

## 2015-02-26 DIAGNOSIS — Z5181 Encounter for therapeutic drug level monitoring: Secondary | ICD-10-CM | POA: Diagnosis not present

## 2015-02-26 DIAGNOSIS — I10 Essential (primary) hypertension: Secondary | ICD-10-CM | POA: Diagnosis not present

## 2015-02-27 DIAGNOSIS — F411 Generalized anxiety disorder: Secondary | ICD-10-CM | POA: Diagnosis not present

## 2015-02-27 DIAGNOSIS — I959 Hypotension, unspecified: Secondary | ICD-10-CM | POA: Diagnosis not present

## 2015-02-27 DIAGNOSIS — M6281 Muscle weakness (generalized): Secondary | ICD-10-CM | POA: Diagnosis not present

## 2015-02-27 DIAGNOSIS — I10 Essential (primary) hypertension: Secondary | ICD-10-CM | POA: Diagnosis not present

## 2015-02-27 DIAGNOSIS — M797 Fibromyalgia: Secondary | ICD-10-CM | POA: Diagnosis not present

## 2015-02-27 DIAGNOSIS — F328 Other depressive episodes: Secondary | ICD-10-CM | POA: Diagnosis not present

## 2015-02-28 DIAGNOSIS — R0789 Other chest pain: Secondary | ICD-10-CM | POA: Diagnosis not present

## 2015-02-28 DIAGNOSIS — I959 Hypotension, unspecified: Secondary | ICD-10-CM | POA: Diagnosis not present

## 2015-03-01 DIAGNOSIS — R609 Edema, unspecified: Secondary | ICD-10-CM | POA: Diagnosis not present

## 2015-03-01 DIAGNOSIS — Z86718 Personal history of other venous thrombosis and embolism: Secondary | ICD-10-CM | POA: Diagnosis not present

## 2015-03-01 DIAGNOSIS — I872 Venous insufficiency (chronic) (peripheral): Secondary | ICD-10-CM | POA: Diagnosis not present

## 2015-03-01 DIAGNOSIS — M79606 Pain in leg, unspecified: Secondary | ICD-10-CM | POA: Diagnosis not present

## 2015-03-02 DIAGNOSIS — I10 Essential (primary) hypertension: Secondary | ICD-10-CM | POA: Diagnosis not present

## 2015-03-02 DIAGNOSIS — M797 Fibromyalgia: Secondary | ICD-10-CM | POA: Diagnosis not present

## 2015-03-02 DIAGNOSIS — F328 Other depressive episodes: Secondary | ICD-10-CM | POA: Diagnosis not present

## 2015-03-02 DIAGNOSIS — F411 Generalized anxiety disorder: Secondary | ICD-10-CM | POA: Diagnosis not present

## 2015-03-02 DIAGNOSIS — R011 Cardiac murmur, unspecified: Secondary | ICD-10-CM | POA: Diagnosis not present

## 2015-03-02 DIAGNOSIS — I959 Hypotension, unspecified: Secondary | ICD-10-CM | POA: Diagnosis not present

## 2015-03-02 DIAGNOSIS — M6281 Muscle weakness (generalized): Secondary | ICD-10-CM | POA: Diagnosis not present

## 2015-03-06 DIAGNOSIS — I10 Essential (primary) hypertension: Secondary | ICD-10-CM | POA: Diagnosis not present

## 2015-03-06 DIAGNOSIS — M797 Fibromyalgia: Secondary | ICD-10-CM | POA: Diagnosis not present

## 2015-03-06 DIAGNOSIS — M6281 Muscle weakness (generalized): Secondary | ICD-10-CM | POA: Diagnosis not present

## 2015-03-06 DIAGNOSIS — F328 Other depressive episodes: Secondary | ICD-10-CM | POA: Diagnosis not present

## 2015-03-06 DIAGNOSIS — I959 Hypotension, unspecified: Secondary | ICD-10-CM | POA: Diagnosis not present

## 2015-03-06 DIAGNOSIS — I2699 Other pulmonary embolism without acute cor pulmonale: Secondary | ICD-10-CM | POA: Diagnosis not present

## 2015-03-06 DIAGNOSIS — F411 Generalized anxiety disorder: Secondary | ICD-10-CM | POA: Diagnosis not present

## 2015-03-09 DIAGNOSIS — I959 Hypotension, unspecified: Secondary | ICD-10-CM | POA: Diagnosis not present

## 2015-03-09 DIAGNOSIS — F328 Other depressive episodes: Secondary | ICD-10-CM | POA: Diagnosis not present

## 2015-03-09 DIAGNOSIS — M797 Fibromyalgia: Secondary | ICD-10-CM | POA: Diagnosis not present

## 2015-03-09 DIAGNOSIS — F411 Generalized anxiety disorder: Secondary | ICD-10-CM | POA: Diagnosis not present

## 2015-03-09 DIAGNOSIS — M6281 Muscle weakness (generalized): Secondary | ICD-10-CM | POA: Diagnosis not present

## 2015-03-09 DIAGNOSIS — I10 Essential (primary) hypertension: Secondary | ICD-10-CM | POA: Diagnosis not present

## 2015-03-13 DIAGNOSIS — H5201 Hypermetropia, right eye: Secondary | ICD-10-CM | POA: Diagnosis not present

## 2015-03-13 DIAGNOSIS — H5212 Myopia, left eye: Secondary | ICD-10-CM | POA: Diagnosis not present

## 2015-03-13 DIAGNOSIS — H524 Presbyopia: Secondary | ICD-10-CM | POA: Diagnosis not present

## 2015-03-13 DIAGNOSIS — H25012 Cortical age-related cataract, left eye: Secondary | ICD-10-CM | POA: Diagnosis not present

## 2015-03-13 DIAGNOSIS — H35371 Puckering of macula, right eye: Secondary | ICD-10-CM | POA: Diagnosis not present

## 2015-03-13 DIAGNOSIS — H26491 Other secondary cataract, right eye: Secondary | ICD-10-CM | POA: Diagnosis not present

## 2015-03-13 DIAGNOSIS — H52223 Regular astigmatism, bilateral: Secondary | ICD-10-CM | POA: Diagnosis not present

## 2015-03-13 DIAGNOSIS — H43812 Vitreous degeneration, left eye: Secondary | ICD-10-CM | POA: Diagnosis not present

## 2015-03-13 DIAGNOSIS — Z961 Presence of intraocular lens: Secondary | ICD-10-CM | POA: Diagnosis not present

## 2015-03-15 DIAGNOSIS — F328 Other depressive episodes: Secondary | ICD-10-CM | POA: Diagnosis not present

## 2015-03-15 DIAGNOSIS — I10 Essential (primary) hypertension: Secondary | ICD-10-CM | POA: Diagnosis not present

## 2015-03-15 DIAGNOSIS — M797 Fibromyalgia: Secondary | ICD-10-CM | POA: Diagnosis not present

## 2015-03-15 DIAGNOSIS — F411 Generalized anxiety disorder: Secondary | ICD-10-CM | POA: Diagnosis not present

## 2015-03-15 DIAGNOSIS — I959 Hypotension, unspecified: Secondary | ICD-10-CM | POA: Diagnosis not present

## 2015-03-15 DIAGNOSIS — M6281 Muscle weakness (generalized): Secondary | ICD-10-CM | POA: Diagnosis not present

## 2015-03-16 DIAGNOSIS — R0602 Shortness of breath: Secondary | ICD-10-CM | POA: Diagnosis not present

## 2015-03-20 DIAGNOSIS — I959 Hypotension, unspecified: Secondary | ICD-10-CM | POA: Diagnosis not present

## 2015-03-20 DIAGNOSIS — F411 Generalized anxiety disorder: Secondary | ICD-10-CM | POA: Diagnosis not present

## 2015-03-20 DIAGNOSIS — F328 Other depressive episodes: Secondary | ICD-10-CM | POA: Diagnosis not present

## 2015-03-20 DIAGNOSIS — M797 Fibromyalgia: Secondary | ICD-10-CM | POA: Diagnosis not present

## 2015-03-20 DIAGNOSIS — I10 Essential (primary) hypertension: Secondary | ICD-10-CM | POA: Diagnosis not present

## 2015-03-20 DIAGNOSIS — M6281 Muscle weakness (generalized): Secondary | ICD-10-CM | POA: Diagnosis not present

## 2015-03-21 DIAGNOSIS — M810 Age-related osteoporosis without current pathological fracture: Secondary | ICD-10-CM | POA: Diagnosis not present

## 2015-03-21 DIAGNOSIS — E785 Hyperlipidemia, unspecified: Secondary | ICD-10-CM | POA: Diagnosis not present

## 2015-03-21 DIAGNOSIS — I10 Essential (primary) hypertension: Secondary | ICD-10-CM | POA: Diagnosis not present

## 2015-03-21 DIAGNOSIS — M109 Gout, unspecified: Secondary | ICD-10-CM | POA: Diagnosis not present

## 2015-03-21 DIAGNOSIS — K219 Gastro-esophageal reflux disease without esophagitis: Secondary | ICD-10-CM | POA: Diagnosis not present

## 2015-03-21 DIAGNOSIS — J309 Allergic rhinitis, unspecified: Secondary | ICD-10-CM | POA: Diagnosis not present

## 2015-03-21 DIAGNOSIS — I509 Heart failure, unspecified: Secondary | ICD-10-CM | POA: Diagnosis not present

## 2015-03-21 DIAGNOSIS — J449 Chronic obstructive pulmonary disease, unspecified: Secondary | ICD-10-CM | POA: Diagnosis not present

## 2015-03-21 DIAGNOSIS — E039 Hypothyroidism, unspecified: Secondary | ICD-10-CM | POA: Diagnosis not present

## 2015-03-21 DIAGNOSIS — Z79899 Other long term (current) drug therapy: Secondary | ICD-10-CM | POA: Diagnosis not present

## 2015-03-21 DIAGNOSIS — K5900 Constipation, unspecified: Secondary | ICD-10-CM | POA: Diagnosis not present

## 2015-03-21 DIAGNOSIS — Z86711 Personal history of pulmonary embolism: Secondary | ICD-10-CM | POA: Diagnosis not present

## 2015-03-21 DIAGNOSIS — F419 Anxiety disorder, unspecified: Secondary | ICD-10-CM | POA: Diagnosis not present

## 2015-03-22 DIAGNOSIS — I7090 Unspecified atherosclerosis: Secondary | ICD-10-CM | POA: Diagnosis not present

## 2015-03-22 DIAGNOSIS — R0989 Other specified symptoms and signs involving the circulatory and respiratory systems: Secondary | ICD-10-CM | POA: Diagnosis not present

## 2015-03-28 DIAGNOSIS — F328 Other depressive episodes: Secondary | ICD-10-CM | POA: Diagnosis not present

## 2015-03-28 DIAGNOSIS — M797 Fibromyalgia: Secondary | ICD-10-CM | POA: Diagnosis not present

## 2015-03-28 DIAGNOSIS — M6281 Muscle weakness (generalized): Secondary | ICD-10-CM | POA: Diagnosis not present

## 2015-03-28 DIAGNOSIS — I959 Hypotension, unspecified: Secondary | ICD-10-CM | POA: Diagnosis not present

## 2015-03-28 DIAGNOSIS — F411 Generalized anxiety disorder: Secondary | ICD-10-CM | POA: Diagnosis not present

## 2015-03-28 DIAGNOSIS — I10 Essential (primary) hypertension: Secondary | ICD-10-CM | POA: Diagnosis not present

## 2015-04-04 ENCOUNTER — Ambulatory Visit (INDEPENDENT_AMBULATORY_CARE_PROVIDER_SITE_OTHER): Payer: Medicare Other | Admitting: Pulmonary Disease

## 2015-04-04 ENCOUNTER — Encounter (INDEPENDENT_AMBULATORY_CARE_PROVIDER_SITE_OTHER): Payer: Self-pay

## 2015-04-04 ENCOUNTER — Encounter: Payer: Self-pay | Admitting: Pulmonary Disease

## 2015-04-04 VITALS — BP 110/70 | HR 93 | Temp 98.1°F | Ht 70.0 in | Wt 175.0 lb

## 2015-04-04 DIAGNOSIS — J387 Other diseases of larynx: Secondary | ICD-10-CM

## 2015-04-04 DIAGNOSIS — F411 Generalized anxiety disorder: Secondary | ICD-10-CM | POA: Insufficient documentation

## 2015-04-04 DIAGNOSIS — K2100 Gastro-esophageal reflux disease with esophagitis, without bleeding: Secondary | ICD-10-CM

## 2015-04-04 DIAGNOSIS — Z6826 Body mass index (BMI) 26.0-26.9, adult: Secondary | ICD-10-CM | POA: Diagnosis not present

## 2015-04-04 DIAGNOSIS — K219 Gastro-esophageal reflux disease without esophagitis: Secondary | ICD-10-CM

## 2015-04-04 DIAGNOSIS — J309 Allergic rhinitis, unspecified: Secondary | ICD-10-CM | POA: Diagnosis not present

## 2015-04-04 DIAGNOSIS — Z8709 Personal history of other diseases of the respiratory system: Secondary | ICD-10-CM

## 2015-04-04 DIAGNOSIS — F418 Other specified anxiety disorders: Secondary | ICD-10-CM | POA: Diagnosis not present

## 2015-04-04 DIAGNOSIS — J449 Chronic obstructive pulmonary disease, unspecified: Secondary | ICD-10-CM | POA: Diagnosis not present

## 2015-04-04 DIAGNOSIS — R0600 Dyspnea, unspecified: Secondary | ICD-10-CM | POA: Diagnosis not present

## 2015-04-04 MED ORDER — LORAZEPAM 1 MG PO TABS: 1.0000 mg | ORAL_TABLET | Freq: Three times a day (TID) | ORAL | Status: DC

## 2015-04-04 MED ORDER — PANTOPRAZOLE SODIUM 40 MG PO TBEC: DELAYED_RELEASE_TABLET | ORAL | Status: DC

## 2015-04-04 NOTE — Patient Instructions (Signed)
Bailey Ashley- it was nice meeting you today and i hope we can help you with your shortness of breath...  Your history suggests that part of the problem is getting the air "IN" and this usually related to a type of chest wall muscle spasm...    And you appear to have some night time reflux that can keep your airways inflammed...  Your CXR report from [facility] indicates clear lungs!  Your breathing test here today indicates no airflow obstruction at the present time!  We also checked your gas-exchange parameter (ambulatory oxygen saturation test) and you stayed in the upper 90's!  Therefore I suggest that we adjust your treatment as follows>>    1) since the Alprazolam has not been helping as a "combination relaxer" I suggest we switch to LORAZEPAM 1mg  three times daily.Marland KitchenMarland Kitchen    2) take the new Sertraline (Zoloft) 50mg  one daily as called in by DrUppin.Marland KitchenMarland Kitchen    3) OK to continue your current NEB meds and inhalers...    4) start a vigorous anti-reflux regimen by taking the new PANTOPRAZOLE (Protonix) 40mg  taken 30 min before the evening meal; don't eat or drink much after dinner in the eve; OK to take the Ranitadine about 1h prior to bedtime w/ a sip of water; elevate the head of your bed at least 6" so you are not lying flat at night...  If you could find a good counselor/ therapist- I would bet that this will help w/ your stress/ anxiety/ depression/ etc and result in your feeling better all the way around...  Call for any questions or if i can be of service in any way...  Let's plan a follow up visit in 12mo, sooner if needed for problems.Marland KitchenMarland Kitchen

## 2015-04-06 DIAGNOSIS — M797 Fibromyalgia: Secondary | ICD-10-CM | POA: Diagnosis not present

## 2015-04-06 DIAGNOSIS — F411 Generalized anxiety disorder: Secondary | ICD-10-CM | POA: Diagnosis not present

## 2015-04-06 DIAGNOSIS — I959 Hypotension, unspecified: Secondary | ICD-10-CM | POA: Diagnosis not present

## 2015-04-06 DIAGNOSIS — I10 Essential (primary) hypertension: Secondary | ICD-10-CM | POA: Diagnosis not present

## 2015-04-06 DIAGNOSIS — M6281 Muscle weakness (generalized): Secondary | ICD-10-CM | POA: Diagnosis not present

## 2015-04-06 DIAGNOSIS — F328 Other depressive episodes: Secondary | ICD-10-CM | POA: Diagnosis not present

## 2015-04-10 DIAGNOSIS — E559 Vitamin D deficiency, unspecified: Secondary | ICD-10-CM | POA: Diagnosis not present

## 2015-04-10 DIAGNOSIS — Z6826 Body mass index (BMI) 26.0-26.9, adult: Secondary | ICD-10-CM | POA: Diagnosis not present

## 2015-04-10 DIAGNOSIS — J449 Chronic obstructive pulmonary disease, unspecified: Secondary | ICD-10-CM | POA: Diagnosis not present

## 2015-04-10 DIAGNOSIS — J309 Allergic rhinitis, unspecified: Secondary | ICD-10-CM | POA: Diagnosis not present

## 2015-04-10 DIAGNOSIS — F418 Other specified anxiety disorders: Secondary | ICD-10-CM | POA: Diagnosis not present

## 2015-04-10 DIAGNOSIS — Z79899 Other long term (current) drug therapy: Secondary | ICD-10-CM | POA: Diagnosis not present

## 2015-04-12 DIAGNOSIS — I2699 Other pulmonary embolism without acute cor pulmonale: Secondary | ICD-10-CM | POA: Diagnosis not present

## 2015-04-17 DIAGNOSIS — R069 Unspecified abnormalities of breathing: Secondary | ICD-10-CM | POA: Diagnosis not present

## 2015-04-18 DIAGNOSIS — J449 Chronic obstructive pulmonary disease, unspecified: Secondary | ICD-10-CM | POA: Diagnosis not present

## 2015-04-19 DIAGNOSIS — L930 Discoid lupus erythematosus: Secondary | ICD-10-CM | POA: Diagnosis not present

## 2015-04-19 DIAGNOSIS — L578 Other skin changes due to chronic exposure to nonionizing radiation: Secondary | ICD-10-CM | POA: Diagnosis not present

## 2015-04-19 DIAGNOSIS — Q828 Other specified congenital malformations of skin: Secondary | ICD-10-CM | POA: Diagnosis not present

## 2015-04-19 DIAGNOSIS — L570 Actinic keratosis: Secondary | ICD-10-CM | POA: Diagnosis not present

## 2015-04-19 DIAGNOSIS — L720 Epidermal cyst: Secondary | ICD-10-CM | POA: Diagnosis not present

## 2015-04-20 DIAGNOSIS — I959 Hypotension, unspecified: Secondary | ICD-10-CM | POA: Diagnosis not present

## 2015-04-20 DIAGNOSIS — I10 Essential (primary) hypertension: Secondary | ICD-10-CM | POA: Diagnosis not present

## 2015-04-20 DIAGNOSIS — J449 Chronic obstructive pulmonary disease, unspecified: Secondary | ICD-10-CM | POA: Diagnosis not present

## 2015-04-20 DIAGNOSIS — M797 Fibromyalgia: Secondary | ICD-10-CM | POA: Diagnosis not present

## 2015-04-20 DIAGNOSIS — F411 Generalized anxiety disorder: Secondary | ICD-10-CM | POA: Diagnosis not present

## 2015-04-20 DIAGNOSIS — F328 Other depressive episodes: Secondary | ICD-10-CM | POA: Diagnosis not present

## 2015-04-20 DIAGNOSIS — M6281 Muscle weakness (generalized): Secondary | ICD-10-CM | POA: Diagnosis not present

## 2015-04-25 DIAGNOSIS — F328 Other depressive episodes: Secondary | ICD-10-CM | POA: Diagnosis not present

## 2015-04-25 DIAGNOSIS — M797 Fibromyalgia: Secondary | ICD-10-CM | POA: Diagnosis not present

## 2015-04-25 DIAGNOSIS — I959 Hypotension, unspecified: Secondary | ICD-10-CM | POA: Diagnosis not present

## 2015-04-25 DIAGNOSIS — M6281 Muscle weakness (generalized): Secondary | ICD-10-CM | POA: Diagnosis not present

## 2015-04-25 DIAGNOSIS — F411 Generalized anxiety disorder: Secondary | ICD-10-CM | POA: Diagnosis not present

## 2015-04-25 DIAGNOSIS — I10 Essential (primary) hypertension: Secondary | ICD-10-CM | POA: Diagnosis not present

## 2015-04-27 DIAGNOSIS — I509 Heart failure, unspecified: Secondary | ICD-10-CM | POA: Diagnosis not present

## 2015-04-27 DIAGNOSIS — F411 Generalized anxiety disorder: Secondary | ICD-10-CM | POA: Diagnosis not present

## 2015-04-27 DIAGNOSIS — Z7901 Long term (current) use of anticoagulants: Secondary | ICD-10-CM | POA: Diagnosis not present

## 2015-04-27 DIAGNOSIS — I10 Essential (primary) hypertension: Secondary | ICD-10-CM | POA: Diagnosis not present

## 2015-04-27 DIAGNOSIS — M797 Fibromyalgia: Secondary | ICD-10-CM | POA: Diagnosis not present

## 2015-04-27 DIAGNOSIS — E039 Hypothyroidism, unspecified: Secondary | ICD-10-CM | POA: Diagnosis not present

## 2015-04-27 DIAGNOSIS — F328 Other depressive episodes: Secondary | ICD-10-CM | POA: Diagnosis not present

## 2015-04-27 DIAGNOSIS — Z5181 Encounter for therapeutic drug level monitoring: Secondary | ICD-10-CM | POA: Diagnosis not present

## 2015-04-27 DIAGNOSIS — I959 Hypotension, unspecified: Secondary | ICD-10-CM | POA: Diagnosis not present

## 2015-04-27 DIAGNOSIS — J45909 Unspecified asthma, uncomplicated: Secondary | ICD-10-CM | POA: Diagnosis not present

## 2015-05-01 DIAGNOSIS — F328 Other depressive episodes: Secondary | ICD-10-CM | POA: Diagnosis not present

## 2015-05-01 DIAGNOSIS — I509 Heart failure, unspecified: Secondary | ICD-10-CM | POA: Diagnosis not present

## 2015-05-01 DIAGNOSIS — F411 Generalized anxiety disorder: Secondary | ICD-10-CM | POA: Diagnosis not present

## 2015-05-01 DIAGNOSIS — I959 Hypotension, unspecified: Secondary | ICD-10-CM | POA: Diagnosis not present

## 2015-05-01 DIAGNOSIS — I10 Essential (primary) hypertension: Secondary | ICD-10-CM | POA: Diagnosis not present

## 2015-05-01 DIAGNOSIS — M797 Fibromyalgia: Secondary | ICD-10-CM | POA: Diagnosis not present

## 2015-05-01 NOTE — Progress Notes (Signed)
Subjective:     Patient ID: Bailey Ashley, female   DOB: 1946/01/20, 69 y.o.   MRN: [id number]  HPI 69 y/o woman referred by DrUppin of [facility] for evaluation of dyspnea; she is here w/ her daughter, [name] she reports approx 1yr hx COPD/"asthma" w/ SOB/ DOE, some cough & wheezing, all worse over the last 3mo; she notes difficulty shopping, cleaning house, climbing stairs, "any activity"; says mild cough, no sput or hemoptysis, but notes "wheezing, chest tight";  She states that "I stay cold" she's on blood thinners and got herself a rolling walker "this helps"; on further questioning she notes that it seems hard to get the air "in", can't get a deep breath, not satisfied w/ her breathing & freq sighs; she admits to some anxiety- "I get worked up" she says, but she's been on Alprazolam 0.5mg  Tid & no better by her hx...     Smoking Hx> she started in her teens and quit in her 65s (over 95 yrs ago) but she did smoke up to 2ppd for <20 pack-yr smoking hx...    Reflux> she has hx signif reflux for yrs she says & takes Ranitadine for this...    Current meds>  Symbicort80-2spBid, ?Qvar80-2spBid, NEB w/ Duoneb Tid, Singulair10Qhs...    PMHx includes prev hx PE and cardiac hx (followed by [facility] in [place] but we don't have their records), ?CHF, she says they did ABG, CXR, ONO via [place] pending...    FamHx reveals that her husb died 5 yrs ago w/ severe COPD & pancreatic cancer, he was an Chief Financial Officer in the [employer] for 29yrs spent [place] in the middle [place], then [place] home (very stressful for pt)...   EXAM reveals Afeb, VSS, O2sat=100% on RA;  Heent- neg, mallampati2;  Chest- clear w/o w/r/r;  Cor- RR w/o m/r/g;  Ext- w/o c/c/e...  CXR 03/16/15 in [place] showed heart wnl in size, lungs clear, NAD...   ABG by [facility] 03/16/15 showed pH=7.41, pCO2=39, pO2=67 on RA  Spirometry 04/04/15 showed FVC=3.32 (90%), FEV1=2.46 (88%), %1sec=74, mid-flows were wnl at  97%predicted...  Ambulatory oxygen test> O2sat= 98% on RA at rest w/ pulse=126/min;  She walked 1 lap w/ lowest O2sat=97% w/ pulse=97% (stopped due to pain & "giving out"  LABS from [place] reviewed> IgE level=9 and all RAST tests NEG;   IMP/PLAN>> we discussed her dyspnea, esp the sensation of "can't get the air in" and lack of objective abnormalities; she is no better on her Alprazolam- therefore suggest switch to LORAZEPAM 1mg Tid;  I feel it would be helpful to establish w/ a good counselor as well, in the meantime try the new Zoloft called in by DrUppin;  She may continue to use her NEBs as needed, and rec starting an exercise program;  In addition we will start PANTOPRAZOLE for her reflux symptoms and an antireflux regimen... Plan ROV 21mo for recheck...    Past Medical History  Diagnosis Date  . Asthma   . GERD (gastroesophageal reflux disease) >> on Ranitadine 300 Bid   . Hyperlipidemia >> on Simva40 Qhs   . Hypothyroid >> on Levothy125   . Osteoporosis   . Depression >> on Trazodone150Qhs & DrUppin just called in [place]   . Psoriasis   . Ulnar neuropathy   . Constipation   . Hypertension >. Prev on Atenolol25- off now    . Bilateral hip pain >> on Naprosyn & Tramadol50 prn   . Edema   . Tachycardia   .  Congestive heart failure >> on ASA81; prev on Lasix40- 2daily & K20Bid- off now   . History of smoking   . Grief reaction >> on Alprazolam 0.5Tid   . Gout >> on Allopurinol100   . Pulmonary embolism    Past Surgical History  Procedure Laterality Date  . Nasal sinus surgery    . Cataract surgery      Outpatient Encounter Prescriptions as of 04/04/2015  Medication Sig  . allopurinol (ZYLOPRIM) 100 MG tablet   . ALPRAZolam (XANAX) 0.5 MG tablet   . aspirin 81 MG chewable tablet Chew 81 mg by mouth daily.  . fluticasone (FLONASE) 50 MCG/ACT nasal spray   . levothyroxine (SYNTHROID, LEVOTHROID) 125 MCG tablet   . meclizine (ANTIVERT) 12.5 MG tablet   .  methylPREDNIsolone (MEDROL DOSPACK) 4 MG tablet   . montelukast (SINGULAIR) 10 MG tablet   . naproxen (NAPROSYN) 375 MG tablet   . ranitidine (ZANTAC) 300 MG tablet   . simvastatin (ZOCOR) 40 MG tablet   . traMADol (ULTRAM) 50 MG tablet   . traZODone (DESYREL) 150 MG tablet   . warfarin (COUMADIN) 2 MG tablet    Allergies  Allergen Reactions  . Contrast Media [Iodinated Diagnostic Agents] Shortness Of Breath  . Sulfa Antibiotics Hives    Family History  Problem Relation Age of Onset  . Cancer Brother     Prostates  . Cancer Paternal Aunt     breast  . Cancer Grandchild     Leukemia  . Heart disease Brother   . Heart disease Father   . Heart disease Paternal Grandfather   . Tuberculosis Mother   . Diabetes Father   . Diabetes Brother   . Diabetes Paternal Grandmother     History   Social History  . Marital Status: Widowed    Spouse Name: N/A  . Number of Children: 4  . Years of Education: N/A   Occupational History  . Not on file.   Social History Main Topics  . Smoking status: Former Smoker -- 2.00 packs/day for 12 years    Types: Cigarettes    Quit date: 12/23/1972  . Smokeless tobacco: Former Systems developer    Quit date: 12/23/1972  . Alcohol Use: No  . Drug Use: No  . Sexual Activity: Not on file   Other Topics Concern  . Not on file   Social History Narrative  . No narrative on file    Current Medications, Allergies, Past Medical History, Past Surgical History, Family History, and Social History were reviewed in [employer] record.   Review of Systems          All symptoms NEG except where BOLDED >>  Constitutional:  F/C/S, fatigue, anorexia, unexpected weight change. HEENT:  HA, visual changes, hearing loss, earache, nasal symptoms, sore throat, mouth sores, hoarseness. Resp:  cough, sputum, hemoptysis; SOB, tightness, wheezing. Cardio:  CP, palpit, DOE, orthopnea, edema. GI:  N/V/D/C, blood in stool; reflux, abd pain,  distention, gas. GU:  dysuria, freq, urgency, hematuria, flank pain, voiding difficulty. MS:  joint pain, swelling, tenderness, decr ROM; neck pain, back pain, etc. Neuro:  HA, tremors, seizures, dizziness, syncope, weakness, numbness, gait abn. Skin:  suspicious lesions or skin rash. Heme:  adenopathy, bruising, bleeding. Psyche:  confusion, agitation, sleep disturbance, hallucinations, anxiety, depression suicidal.   Objective:   Physical Exam    Vital Signs:  Reviewed...  General:  WD, WN, 69 y/o WF in NAD; alert & oriented; pleasant & cooperative... HEENT:  [place]/AT; Conjunctiva- pink, Sclera- nonicteric, EOM-wnl, PERRLA, EACs-clear, TMs-wnl; NOSE-clear; THROAT-clear & wnl. Neck:  Supple w/ fair ROM; no JVD; normal carotid impulses w/o bruits; no thyromegaly or nodules palpated; no lymphadenopathy. Chest:  Clear to P & A; without wheezes, rales, or rhonchi heard. Heart:  Regular Rhythm; norm S1 & S2 without murmurs, rubs, or gallops detected. Abdomen:  Soft & nontender- no guarding or rebound; normal bowel sounds; no organomegaly or masses palpated. Ext:  Normal ROM; without deformities, +arthritic changes; no varicose veins,+ venous insuffic, tr edema;  Pulses intact w/o bruits. Neuro:  CNs II-XII intact; motor testing normal; sensory testing normal; gait normal & balance OK. Derm:  No lesions noted; no rash etc. Lymph:  No cervical, supraclavicular, axillary, or inguinal adenopathy palpated.   Assessment:      IMP >> Dyspnea- likely multifactorial w/ anxiety, chest wall factors, reflux playing a roll...  Hx CHF GERD Anxiety/ Depression  PLAN >>  We discussed her dyspnea, esp the sensation of "can't get the air in" and lack of objective abnormalities; she is no better on her Alprazolam- therefore suggest switch to LORAZEPAM 1mg Tid;  I feel it would be helpful to establish w/ a good counselor as well, in the meantime try the new Zoloft called in by [place];  She may continue to  use her NEBs as needed, and rec starting an exercise program;  In addition we will start PANTOPRAZOLE for her reflux symptoms and an antireflux regimen... Plan ROV 63mo for recheck     Plan:     Patient's Medications  New Prescriptions   LORAZEPAM (ATIVAN) 1 MG TABLET    Take 1 tablet (1 mg total) by mouth 3 (three) times daily.   PANTOPRAZOLE (PROTONIX) 40 MG TABLET    Take 1 tablet by mouth once daily 3 minutes before evening meal  Previous Medications   ALLOPURINOL (ZYLOPRIM) 100 MG TABLET       ALPRAZOLAM (XANAX) 0.5 MG TABLET       ASPIRIN 81 MG CHEWABLE TABLET    Chew 81 mg by mouth daily.   FLUTICASONE (FLONASE) 50 MCG/ACT NASAL SPRAY       LEVOTHYROXINE (SYNTHROID, LEVOTHROID) 125 MCG TABLET       MECLIZINE (ANTIVERT) 12.5 MG TABLET       METHYLPREDNISOLONE (MEDROL DOSPACK) 4 MG TABLET       MONTELUKAST (SINGULAIR) 10 MG TABLET       NAPROXEN (NAPROSYN) 375 MG TABLET       RANITIDINE (ZANTAC) 300 MG TABLET       SIMVASTATIN (ZOCOR) 40 MG TABLET       TRAMADOL (ULTRAM) 50 MG TABLET       TRAZODONE (DESYREL) 150 MG TABLET       WARFARIN (COUMADIN) 2 MG TABLET       WARFARIN (COUMADIN) 5 MG TABLET      Modified Medications   No medications on file  Discontinued Medications   No medications on file

## 2015-05-04 ENCOUNTER — Encounter (INDEPENDENT_AMBULATORY_CARE_PROVIDER_SITE_OTHER): Payer: Self-pay

## 2015-05-04 ENCOUNTER — Encounter: Payer: Self-pay | Admitting: Pulmonary Disease

## 2015-05-04 ENCOUNTER — Ambulatory Visit (INDEPENDENT_AMBULATORY_CARE_PROVIDER_SITE_OTHER): Payer: Medicare Other | Admitting: Pulmonary Disease

## 2015-05-04 VITALS — BP 104/60 | HR 84 | Temp 97.2°F | Wt 173.6 lb

## 2015-05-04 DIAGNOSIS — J387 Other diseases of larynx: Secondary | ICD-10-CM | POA: Diagnosis not present

## 2015-05-04 DIAGNOSIS — R0600 Dyspnea, unspecified: Secondary | ICD-10-CM | POA: Diagnosis not present

## 2015-05-04 DIAGNOSIS — F411 Generalized anxiety disorder: Secondary | ICD-10-CM

## 2015-05-04 DIAGNOSIS — K2100 Gastro-esophageal reflux disease with esophagitis, without bleeding: Secondary | ICD-10-CM

## 2015-05-04 DIAGNOSIS — K219 Gastro-esophageal reflux disease without esophagitis: Secondary | ICD-10-CM

## 2015-05-04 DIAGNOSIS — Z8709 Personal history of other diseases of the respiratory system: Secondary | ICD-10-CM | POA: Diagnosis not present

## 2015-05-04 MED ORDER — ALBUTEROL SULFATE HFA 108 (90 BASE) MCG/ACT IN AERS: 2.0000 | INHALATION_SPRAY | Freq: Four times a day (QID) | RESPIRATORY_TRACT | Status: DC | PRN

## 2015-05-04 MED ORDER — TIOTROPIUM BROMIDE-OLODATEROL 2.5-2.5 MCG/ACT IN AERS: 1.0000 | INHALATION_SPRAY | Freq: Every day | RESPIRATORY_TRACT | Status: AC

## 2015-05-04 NOTE — Progress Notes (Signed)
Subjective:     Patient ID: Bailey Ashley, female   DOB: August 18, 1946, 69 y.o.   MRN: [id number]  HPI  ~  April 04, 2015:  Initial consult by SN>   20 y/o woman referred by DrUppin of [facility] for evaluation of dyspnea; she is here w/ her daughter, Bailey Ashley she reports approx 59yr hx COPD/"asthma" w/ SOB/ DOE, some cough & wheezing, all worse over the last 77mo; she notes difficulty shopping, cleaning house, climbing stairs, "any activity"; says mild cough, no sput or hemoptysis, but notes "wheezing, chest tight";  She states that "I stay cold" she's on blood thinners and got herself a rolling walker "this helps"; on further questioning she notes that it seems hard to get the air "in", can't get a deep breath, not satisfied w/ her breathing & freq sighs; she admits to some anxiety- "I get worked up" she says, but she's been on Alprazolam 0.5mg  Tid & no better by her hx...     Smoking Hx> she started in her teens and quit in her 54s (over 33 yrs ago) but she did smoke up to 2ppd for <20 pack-yr smoking hx...    Reflux> she has hx signif reflux for yrs she says & takes Ranitadine for this...    Current meds>  Symbicort80-2spBid, ?Qvar80-2spBid, NEB w/ Duoneb Tid, Singulair10Qhs...    PMHx includes prev hx PE and cardiac hx (followed by [facility] in [place] but we don't have their records), ?CHF, she says they did ABG, CXR, ONO via [place] pending...    FamHx reveals that her husb died 5 yrs ago w/ severe COPD & pancreatic cancer, he was an Chief Financial Officer in the [employer] for 72yrs spent [place] in the middle [place], then [place] home (very stressful for pt)...   EXAM reveals Afeb, VSS, O2sat=100% on RA;  Heent- neg, mallampati2;  Chest- clear w/o w/r/r;  Cor- RR w/o m/r/g;  Ext- w/o c/c/e...  CXR 03/16/15 in [place] showed heart wnl in size, lungs clear, NAD...   ABG by [facility] 03/16/15 showed pH=7.41, pCO2=39, pO2=67 on RA  Spirometry 04/04/15 showed FVC=3.32 (90%), FEV1=2.46 (88%),  %1sec=74, mid-flows were wnl at 97%predicted...  Ambulatory oxygen test> O2sat= 98% on RA at rest w/ pulse=126/min;  She walked 1 lap w/ lowest O2sat=97% w/ pulse=97% (stopped due to pain & "giving out"  LABS from [place] reviewed> IgE level=9 and all RAST tests NEG;   IMP/PLAN>> we discussed her dyspnea, esp the sensation of "can't get the air in" and lack of objective abnormalities; she is no better on her Alprazolam- therefore suggest switch to LORAZEPAM 1mg Tid;  I feel it would be helpful to establish w/ a good counselor as well, in the meantime try the new Zoloft called in by DrUppin;  She may continue to use her NEBs as needed, and rec starting an exercise program;  In addition we will start PANTOPRAZOLE for her reflux symptoms and an antireflux regimen... Plan ROV 53mo for recheck...  ~  May 04, 2015:  67mo ROV w/ SN>  Bailey Ashley was seen 72mo ago w/ asthma, SOB w/ any activity, wheezing & tight in the chest, anxiety related to the passing of her husb several yrs ago; we adjusted her meds in the interval to account for her insurance coverage to [employer] 1/d, Qvar80-2spBid, Lorazepam1mg Tid, ProtonixQd, and counseling (which she did not do); she reports much better on these meds- able to get deeper breath, feels more satisfied breathing, etc;  She is now ambulating w/  her 4pt cane & not the walker, doing some housework and in the yard... EXAM remains clear & she is in better spirits...  We discussed ROV in 8mo sooner if needed prn...    Past Medical History  Diagnosis Date  . Asthma   . GERD (gastroesophageal reflux disease) >> on Ranitadine 300 Bid   . Hyperlipidemia >> on Simva40 Qhs   . Hypothyroid >> on Levothy125   . Osteoporosis   . Depression >> on Trazodone150Qhs & DrUppin just called in [place]   . Psoriasis   . Ulnar neuropathy   . Constipation   . Hypertension >. Prev on Atenolol25- off now    . Bilateral hip pain >> on Naprosyn & Tramadol50 prn   . Edema   . Tachycardia   .  Congestive heart failure >> on ASA81; prev on Lasix40- 2daily & K20Bid- off now   . History of smoking   . Grief reaction >> on Alprazolam 0.5Tid   . Gout >> on Allopurinol100   . Pulmonary embolism    Past Surgical History  Procedure Laterality Date  . Nasal sinus surgery    . Cataract surgery      Outpatient Encounter Prescriptions as of 05/04/2015  Medication Sig  . allopurinol (ZYLOPRIM) 100 MG tablet   . ALPRAZolam (XANAX) 0.5 MG tablet   . aspirin 81 MG chewable tablet Chew 81 mg by mouth daily.  . beclomethasone (QVAR) 80 MCG/ACT inhaler Inhale 2 puffs into the lungs 2 (two) times daily.  . fluticasone (FLONASE) 50 MCG/ACT nasal spray   . levothyroxine (SYNTHROID, LEVOTHROID) 125 MCG tablet 125 mcg daily.  Marland Kitchen LORazepam (ATIVAN) 1 MG tablet Take 1 tablet (1 mg total) by mouth 3 (three) times daily.  . meclizine (ANTIVERT) 12.5 MG tablet   . mometasone (NASONEX) 50 MCG/ACT nasal spray Place 1 spray into the nose daily.  . naproxen (NAPROSYN) 375 MG tablet 375 mg daily as needed.  . pantoprazole (PROTONIX) 40 MG tablet 40 mg daily.  . rivaroxaban (XARELTO) 20 MG TABS tablet Take 20 mg by mouth daily with supper.  . sertraline (ZOLOFT) 100 MG tablet Take 50 mg by mouth daily.  . Tiotropium Bromide-Olodaterol (STIOLTO RESPIMAT IN) Inhale 1 puff into the lungs daily.  . traZODone (DESYREL) 150 MG tablet 150 mg at bedtime.  . methylPREDNIsolone (MEDROL DOSPACK) 4 MG tablet     Allergies  Allergen Reactions  . Contrast Media [Iodinated Diagnostic Agents] Shortness Of Breath  . Sulfa Antibiotics Hives    Current Medications, Allergies, Past Medical History, Past Surgical History, Family History, and Social History were reviewed in [employer] record.   Review of Systems          All symptoms NEG except where BOLDED >>  Constitutional:  F/C/S, fatigue, anorexia, unexpected weight change. HEENT:  HA, visual changes, hearing loss, earache, nasal symptoms,  sore throat, mouth sores, hoarseness. Resp:  cough, sputum, hemoptysis; SOB, tightness, wheezing. Cardio:  CP, palpit, DOE, orthopnea, edema. GI:  N/V/D/C, blood in stool; reflux, abd pain, distention, gas. GU:  dysuria, freq, urgency, hematuria, flank pain, voiding difficulty. MS:  joint pain, swelling, tenderness, decr ROM; neck pain, back pain, etc. Neuro:  HA, tremors, seizures, dizziness, syncope, weakness, numbness, gait abn. Skin:  suspicious lesions or skin rash. Heme:  adenopathy, bruising, bleeding. Psyche:  confusion, agitation, sleep disturbance, hallucinations, anxiety, depression suicidal.   Objective:   Physical Exam    Vital Signs:  Reviewed...  General:  WD, WN,  69 y/o WF in NAD; alert & oriented; pleasant & cooperative... HEENT:  [place]/AT; Conjunctiva- pink, Sclera- nonicteric, EOM-wnl, PERRLA, EACs-clear, TMs-wnl; NOSE-clear; THROAT-clear & wnl. Neck:  Supple w/ fair ROM; no JVD; normal carotid impulses w/o bruits; no thyromegaly or nodules palpated; no lymphadenopathy. Chest:  Clear to P & A; without wheezes, rales, or rhonchi heard. Heart:  Regular Rhythm; norm S1 & S2 without murmurs, rubs, or gallops detected. Abdomen:  Soft & nontender- no guarding or rebound; normal bowel sounds; no organomegaly or masses palpated. Ext:  Normal ROM; without deformities, +arthritic changes; no varicose veins,+ venous insuffic, tr edema;  Pulses intact w/o bruits. Neuro:  CNs II-XII intact; motor testing normal; sensory testing normal; gait normal & balance OK. Derm:  No lesions noted; no rash etc. Lymph:  No cervical, supraclavicular, axillary, or inguinal adenopathy palpated.   Assessment:      IMP >> Dyspnea- likely multifactorial w/ anxiety, chest wall factors, reflux playing a roll...  Hx Asthma Hx CHF GERD Anxiety/ Depression  PLAN >>  She is much improved on Stiolto 1/d, Qvar80-2spBid, Protonix40, and Lorazepam1mg Tid... Rec to continue same for now and we will plan to  slowly wean over time...     Plan:     Patient's Medications  New Prescriptions   ALBUTEROL (PROVENTIL HFA;VENTOLIN HFA) 108 (90 BASE) MCG/ACT INHALER    Inhale 2 puffs into the lungs every 6 (six) hours as needed for wheezing or shortness of breath.  Previous Medications   ALLOPURINOL (ZYLOPRIM) 100 MG TABLET       ASPIRIN 81 MG CHEWABLE TABLET    Chew 81 mg by mouth daily.   BECLOMETHASONE (QVAR) 80 MCG/ACT INHALER    Inhale 2 puffs into the lungs 2 (two) times daily.   FLUTICASONE (FLONASE) 50 MCG/ACT NASAL SPRAY       LEVOTHYROXINE (SYNTHROID, LEVOTHROID) 125 MCG TABLET    125 mcg daily.   LORAZEPAM (ATIVAN) 1 MG TABLET    Take 1 tablet (1 mg total) by mouth 3 (three) times daily.   MECLIZINE (ANTIVERT) 12.5 MG TABLET       NAPROXEN (NAPROSYN) 375 MG TABLET    375 mg daily as needed.   PANTOPRAZOLE (PROTONIX) 40 MG TABLET    40 mg daily.   RIVAROXABAN (XARELTO) 20 MG TABS TABLET    Take 20 mg by mouth daily with supper.   SERTRALINE (ZOLOFT) 100 MG TABLET    Take 50 mg by mouth daily.   TIOTROPIUM BROMIDE-OLODATEROL (STIOLTO RESPIMAT IN)    Inhale 1 puff into the lungs daily.   TRAZODONE (DESYREL) 150 MG TABLET    150 mg at bedtime.  Modified Medications   No medications on file  Discontinued Medications   ALPRAZOLAM (XANAX) 0.5 MG TABLET       LEVOTHYROXINE (SYNTHROID, LEVOTHROID) 125 MCG TABLET       METHYLPREDNISOLONE (MEDROL DOSPACK) 4 MG TABLET       MOMETASONE (NASONEX) 50 MCG/ACT NASAL SPRAY    Place 1 spray into the nose daily.   MONTELUKAST (SINGULAIR) 10 MG TABLET    10 mg at bedtime.    NAPROXEN (NAPROSYN) 375 MG TABLET       PANTOPRAZOLE (PROTONIX) 40 MG TABLET    Take 1 tablet by mouth once daily 3 minutes before evening meal   RANITIDINE (ZANTAC) 300 MG TABLET       SIMVASTATIN (ZOCOR) 40 MG TABLET       TRAMADOL (ULTRAM) 50 MG TABLET  TRAZODONE (DESYREL) 150 MG TABLET       WARFARIN (COUMADIN) 2 MG TABLET       WARFARIN (COUMADIN) 5 MG TABLET

## 2015-05-04 NOTE — Patient Instructions (Signed)
Today we updated your med list in our EPIC system...    Continue your current medications the same...  It sounds like your breathing is better on the current asthma meds (Stiolto, Qvar, Singulair)...    and the combination relaxer- Lorazepam 3 times daily... Continue same...  Continue the antireflux regimen & the Protonix...  Keep up the good work w/ your exercise program...  Call for any questions...  Let's plan a follow up visit in 86mo, sooner if needed for problems.Marland KitchenMarland Kitchen

## 2015-05-05 DIAGNOSIS — I509 Heart failure, unspecified: Secondary | ICD-10-CM | POA: Diagnosis not present

## 2015-05-05 DIAGNOSIS — I10 Essential (primary) hypertension: Secondary | ICD-10-CM | POA: Diagnosis not present

## 2015-05-05 DIAGNOSIS — F328 Other depressive episodes: Secondary | ICD-10-CM | POA: Diagnosis not present

## 2015-05-05 DIAGNOSIS — F411 Generalized anxiety disorder: Secondary | ICD-10-CM | POA: Diagnosis not present

## 2015-05-05 DIAGNOSIS — M797 Fibromyalgia: Secondary | ICD-10-CM | POA: Diagnosis not present

## 2015-05-05 DIAGNOSIS — I959 Hypotension, unspecified: Secondary | ICD-10-CM | POA: Diagnosis not present

## 2015-05-10 DIAGNOSIS — F328 Other depressive episodes: Secondary | ICD-10-CM | POA: Diagnosis not present

## 2015-05-10 DIAGNOSIS — I509 Heart failure, unspecified: Secondary | ICD-10-CM | POA: Diagnosis not present

## 2015-05-10 DIAGNOSIS — M797 Fibromyalgia: Secondary | ICD-10-CM | POA: Diagnosis not present

## 2015-05-10 DIAGNOSIS — I959 Hypotension, unspecified: Secondary | ICD-10-CM | POA: Diagnosis not present

## 2015-05-10 DIAGNOSIS — F411 Generalized anxiety disorder: Secondary | ICD-10-CM | POA: Diagnosis not present

## 2015-05-10 DIAGNOSIS — I10 Essential (primary) hypertension: Secondary | ICD-10-CM | POA: Diagnosis not present

## 2015-05-17 DIAGNOSIS — F411 Generalized anxiety disorder: Secondary | ICD-10-CM | POA: Diagnosis not present

## 2015-05-17 DIAGNOSIS — F328 Other depressive episodes: Secondary | ICD-10-CM | POA: Diagnosis not present

## 2015-05-17 DIAGNOSIS — I959 Hypotension, unspecified: Secondary | ICD-10-CM | POA: Diagnosis not present

## 2015-05-17 DIAGNOSIS — I10 Essential (primary) hypertension: Secondary | ICD-10-CM | POA: Diagnosis not present

## 2015-05-17 DIAGNOSIS — M797 Fibromyalgia: Secondary | ICD-10-CM | POA: Diagnosis not present

## 2015-05-17 DIAGNOSIS — I509 Heart failure, unspecified: Secondary | ICD-10-CM | POA: Diagnosis not present

## 2015-05-24 DIAGNOSIS — F411 Generalized anxiety disorder: Secondary | ICD-10-CM | POA: Diagnosis not present

## 2015-05-24 DIAGNOSIS — I10 Essential (primary) hypertension: Secondary | ICD-10-CM | POA: Diagnosis not present

## 2015-05-24 DIAGNOSIS — I509 Heart failure, unspecified: Secondary | ICD-10-CM | POA: Diagnosis not present

## 2015-05-24 DIAGNOSIS — F328 Other depressive episodes: Secondary | ICD-10-CM | POA: Diagnosis not present

## 2015-05-24 DIAGNOSIS — M797 Fibromyalgia: Secondary | ICD-10-CM | POA: Diagnosis not present

## 2015-05-24 DIAGNOSIS — I959 Hypotension, unspecified: Secondary | ICD-10-CM | POA: Diagnosis not present

## 2015-05-30 DIAGNOSIS — I959 Hypotension, unspecified: Secondary | ICD-10-CM | POA: Diagnosis not present

## 2015-05-30 DIAGNOSIS — I10 Essential (primary) hypertension: Secondary | ICD-10-CM | POA: Diagnosis not present

## 2015-05-30 DIAGNOSIS — F411 Generalized anxiety disorder: Secondary | ICD-10-CM | POA: Diagnosis not present

## 2015-05-30 DIAGNOSIS — I509 Heart failure, unspecified: Secondary | ICD-10-CM | POA: Diagnosis not present

## 2015-05-30 DIAGNOSIS — M797 Fibromyalgia: Secondary | ICD-10-CM | POA: Diagnosis not present

## 2015-05-30 DIAGNOSIS — F328 Other depressive episodes: Secondary | ICD-10-CM | POA: Diagnosis not present

## 2015-06-14 DIAGNOSIS — E785 Hyperlipidemia, unspecified: Secondary | ICD-10-CM | POA: Diagnosis not present

## 2015-06-14 DIAGNOSIS — G4700 Insomnia, unspecified: Secondary | ICD-10-CM | POA: Diagnosis not present

## 2015-06-14 DIAGNOSIS — Z79899 Other long term (current) drug therapy: Secondary | ICD-10-CM | POA: Diagnosis not present

## 2015-06-14 DIAGNOSIS — Z9181 History of falling: Secondary | ICD-10-CM | POA: Diagnosis not present

## 2015-06-14 DIAGNOSIS — E039 Hypothyroidism, unspecified: Secondary | ICD-10-CM | POA: Diagnosis not present

## 2015-06-14 DIAGNOSIS — M329 Systemic lupus erythematosus, unspecified: Secondary | ICD-10-CM | POA: Diagnosis not present

## 2015-06-14 DIAGNOSIS — Z6825 Body mass index (BMI) 25.0-25.9, adult: Secondary | ICD-10-CM | POA: Diagnosis not present

## 2015-06-14 DIAGNOSIS — K219 Gastro-esophageal reflux disease without esophagitis: Secondary | ICD-10-CM | POA: Diagnosis not present

## 2015-06-14 DIAGNOSIS — R7989 Other specified abnormal findings of blood chemistry: Secondary | ICD-10-CM | POA: Diagnosis not present

## 2015-06-14 DIAGNOSIS — M109 Gout, unspecified: Secondary | ICD-10-CM | POA: Diagnosis not present

## 2015-06-14 DIAGNOSIS — J309 Allergic rhinitis, unspecified: Secondary | ICD-10-CM | POA: Diagnosis not present

## 2015-06-27 DIAGNOSIS — Z86711 Personal history of pulmonary embolism: Secondary | ICD-10-CM | POA: Diagnosis not present

## 2015-06-27 DIAGNOSIS — I959 Hypotension, unspecified: Secondary | ICD-10-CM | POA: Diagnosis not present

## 2015-08-17 DIAGNOSIS — K219 Gastro-esophageal reflux disease without esophagitis: Secondary | ICD-10-CM | POA: Diagnosis not present

## 2015-08-17 DIAGNOSIS — I10 Essential (primary) hypertension: Secondary | ICD-10-CM | POA: Diagnosis not present

## 2015-08-17 DIAGNOSIS — J309 Allergic rhinitis, unspecified: Secondary | ICD-10-CM | POA: Diagnosis not present

## 2015-08-17 DIAGNOSIS — Z79899 Other long term (current) drug therapy: Secondary | ICD-10-CM | POA: Diagnosis not present

## 2015-08-17 DIAGNOSIS — G4700 Insomnia, unspecified: Secondary | ICD-10-CM | POA: Diagnosis not present

## 2015-08-17 DIAGNOSIS — F418 Other specified anxiety disorders: Secondary | ICD-10-CM | POA: Diagnosis not present

## 2015-08-17 DIAGNOSIS — Z6824 Body mass index (BMI) 24.0-24.9, adult: Secondary | ICD-10-CM | POA: Diagnosis not present

## 2015-08-17 DIAGNOSIS — E039 Hypothyroidism, unspecified: Secondary | ICD-10-CM | POA: Diagnosis not present

## 2015-08-17 DIAGNOSIS — M1990 Unspecified osteoarthritis, unspecified site: Secondary | ICD-10-CM | POA: Diagnosis not present

## 2015-08-17 DIAGNOSIS — Z86711 Personal history of pulmonary embolism: Secondary | ICD-10-CM | POA: Diagnosis not present

## 2015-08-23 DIAGNOSIS — M25552 Pain in left hip: Secondary | ICD-10-CM | POA: Diagnosis not present

## 2015-08-23 DIAGNOSIS — W19XXXA Unspecified fall, initial encounter: Secondary | ICD-10-CM | POA: Diagnosis not present

## 2015-08-23 DIAGNOSIS — R0781 Pleurodynia: Secondary | ICD-10-CM | POA: Diagnosis not present

## 2015-08-23 DIAGNOSIS — M79645 Pain in left finger(s): Secondary | ICD-10-CM | POA: Diagnosis not present

## 2015-09-01 ENCOUNTER — Telehealth: Payer: Self-pay | Admitting: Pulmonary Disease

## 2015-09-01 NOTE — Telephone Encounter (Signed)
[email or web address] x1 

## 2015-09-01 NOTE — Telephone Encounter (Signed)
Patient returned call, can be reached at [phone].

## 2015-09-04 ENCOUNTER — Ambulatory Visit: Payer: Medicare Other | Admitting: Pulmonary Disease

## 2015-09-04 NOTE — Telephone Encounter (Signed)
lmtcb

## 2015-09-04 NOTE — Telephone Encounter (Signed)
Spoke to the pt's daughter. Informed her we do not have any Xarelto samples at this time. The pt's daughter states the pt spoke with a nurse and was advised we have samples. I informed the daughter that per the EMR there has not been communication between a nurse and the pt letting her know we have samples. Pt's daughter verbalized understanding and denied any further questions or concerns at this time.

## 2015-09-12 ENCOUNTER — Ambulatory Visit (INDEPENDENT_AMBULATORY_CARE_PROVIDER_SITE_OTHER): Payer: Medicare Other | Admitting: Pulmonary Disease

## 2015-09-12 ENCOUNTER — Encounter: Payer: Self-pay | Admitting: Pulmonary Disease

## 2015-09-12 VITALS — BP 110/68 | HR 72 | Temp 98.2°F | Wt 166.6 lb

## 2015-09-12 DIAGNOSIS — J387 Other diseases of larynx: Secondary | ICD-10-CM

## 2015-09-12 DIAGNOSIS — Z8709 Personal history of other diseases of the respiratory system: Secondary | ICD-10-CM | POA: Diagnosis not present

## 2015-09-12 DIAGNOSIS — K219 Gastro-esophageal reflux disease without esophagitis: Secondary | ICD-10-CM | POA: Diagnosis not present

## 2015-09-12 DIAGNOSIS — R0600 Dyspnea, unspecified: Secondary | ICD-10-CM | POA: Diagnosis not present

## 2015-09-12 DIAGNOSIS — F411 Generalized anxiety disorder: Secondary | ICD-10-CM

## 2015-09-12 MED ORDER — AZITHROMYCIN 250 MG PO TABS: ORAL_TABLET | ORAL | Status: DC

## 2015-09-12 NOTE — Progress Notes (Signed)
Subjective:     Patient ID: Bailey Ashley, female   DOB: 19-Feb-1946, 69 y.o.   MRN: [id number]  HPI ~  April 04, 2015:  Initial consult by SN>   63 y/o woman referred by Bailey Ashley of [facility] for evaluation of dyspnea; she is here w/ her daughter, Bailey Ashley she reports approx 26yr hx COPD/"asthma" w/ SOB/ DOE, some cough & wheezing, all worse over the last 71mo; she notes difficulty shopping, cleaning house, climbing stairs, "any activity"; says mild cough, no sput or hemoptysis, but notes "wheezing, chest tight";  She states that "I stay cold" she's on blood thinners and got herself a rolling walker "this helps"; on further questioning she notes that it seems hard to get the air "in", can't get a deep breath, not satisfied w/ her breathing & freq sighs; she admits to some anxiety- "I get worked up" she says, but she's been on Alprazolam 0.5mg  Tid & no better by her hx...     Smoking Hx> she started in her teens and quit in her 58s (over 47 yrs ago) but she did smoke up to 2ppd for <20 pack-yr smoking hx...    Reflux> she has hx signif reflux for yrs she says & takes Ranitadine for this...    Current meds>  Symbicort80-2spBid, ?Qvar80-2spBid, NEB w/ Duoneb Tid, Singulair10Qhs...    PMHx includes prev hx PE and cardiac hx (followed by Bailey Ashley in [place] but we don't have their records), ?CHF, she says they did ABG, CXR, ONO via [place] pending...    FamHx reveals that her husb died 5 yrs ago w/ severe COPD & pancreatic cancer, he was an Chief Financial Officer in [employer] for 29yrs spent [place] in the [place], then [place] (very stressful for pt)...   EXAM reveals Afeb, VSS, O2sat=100% on RA;  Heent- neg, mallampati2;  Chest- clear w/o w/r/r;  Cor- RR w/o m/r/g;  Ext- w/o c/c/e...  CXR 03/16/15 in [place] showed heart wnl in size, lungs clear, NAD...   ABG by Ashley 03/16/15 showed pH=7.41, pCO2=39, pO2=67 on RA  Spirometry 04/04/15 showed FVC=3.32 (90%), FEV1=2.46 (88%), %1sec=74,  mid-flows were wnl at 97%predicted...  Ambulatory oxygen test> O2sat= 98% on RA at rest w/ pulse=126/min;  She walked 1 lap w/ lowest O2sat=97% w/ pulse=97% (stopped due to pain & "giving out"  LABS from [place] reviewed> IgE level=9 and all RAST tests NEG;  IMP/PLAN>> we discussed her dyspnea, esp the sensation of "can't get the air in" and lack of objective abnormalities; she is no better on her Alprazolam- therefore suggest switch to LORAZEPAM 1mg Tid;  I feel it would be helpful to establish w/ a good counselor as well, in the meantime try the new Zoloft called in by Bailey Ashley;  She may continue to use her NEBs as needed, and rec starting an exercise program;  In addition we will start PANTOPRAZOLE for her reflux symptoms and an antireflux regimen... Plan ROV 33mo for recheck...   ~  May 04, 2015:  79mo ROV w/ SN>  Bailey Ashley was seen 31mo ago w/ asthma, SOB w/ any activity, wheezing & tight in the chest, anxiety related to the passing of her husb several yrs ago; we adjusted her meds in the interval to account for her insurance coverage to [employer] 1/d, Qvar80-2spBid, Lorazepam1mg Tid, ProtonixQd, and counseling (which she did not do); she reports much better on these meds- able to get deeper breath, feels more satisfied breathing, etc;  She is now ambulating w/ her  4pt cane & not the walker, doing some housework and in the yard... EXAM remains clear & she is in better spirits...  We discussed ROV in 77mo sooner if needed prn...   ~  September 12, 2015:  2mo ROV w/ SN>  [name] returns feeling well 7 doing satis; she had a summer vacation in [place] visiting family- she did well x for some trouble w/ the pollen and one sudden syncopal spell, seen by physician in [place] & nothing found, she denies similat prob before or since;  The pollen has recently caused some sneezing, cough w/ yellow mucus, & congestion- we discussed Rx w/ ZPak, Claritin/ Flonase, Mucinex, Delsym... We reviewed the following medical problems  during today's office visit >>     Dyspnea- likely multifactorial w/ anxiety, chest wall factors, reflux playing a roll => improved on Rx & reminded about exercise...    Hx Asthma> on Stiolto- one puff daily & ProairHFA prn; doing satis w/o wheezing, tightness, etc...    Hx PE> on Xarelto per Ashley in [place], we do not have their records...    Hx CHF> followed by [facility] in [place], we do not have their records, Echo results, etc; pt not on any other cardiac meds....    Hyperlipid> on Simva40 per PCP in [place]    GERD> on Protonix40 & Ranitadine 300mg bid; we reviewed the need for antireflux regimen (elev HOB, NPO after dinner, etc)...    Hypothyroid> on Synthroid125 & followed by her PCP...    Anxiety/ Depression> on Lorazepam 1mg - 1/2 to 1 tab Tid & Zoloft100- 1/2 tab daily; husb passed away 17yrs ago & very stressful for pt... EXAM reveals Afeb, VSS, O2sat=99% on RA;  Heent- neg, mallampati2;  Chest- clear w/o w/r/r;  Cor- RR w/o m/r/g;  Ext- w/o c/c/e... IMP/PLAN>>  We wrote for a ZPak 7 some refills for prn use; she will continue w/ the Stiolto, Lorazepam, and Zoloft; we reviewed the need for a progressive exercise program; plan ROV in 54mo, sooner if needed prn...    Past Medical History  Diagnosis Date  . Asthma   . GERD (gastroesophageal reflux disease) >> on Protonix40 & Ranitadine 300 Bid   . Hyperlipidemia >> on Simva40 Qhs   . Hypothyroid >> on Levothy125   . Osteoporosis   . Depression >> on Trazodone150Qhs & Bailey Ashley just called in [place]   . Psoriasis   . Ulnar neuropathy   . Constipation   . Hypertension >. Prev on Atenolol25- off now    . Bilateral hip pain >> on Naprosyn & Tramadol50 prn   . Edema   . Tachycardia   . Congestive heart failure >> on ASA81; prev on Lasix40- 2daily & K20Bid- off now   . History of smoking   . Grief reaction >> on Alprazolam 0.5Tid   . Gout >> on Allopurinol100   . Pulmonary embolism    Past Surgical History  Procedure  Laterality Date  . Nasal sinus surgery    . Cataract surgery      Outpatient Encounter Prescriptions as of 09/12/2015  Medication Sig  . albuterol (PROVENTIL HFA;VENTOLIN HFA) 108 (90 BASE) MCG/ACT inhaler Inhale 2 puffs into the lungs every 6 (six) hours as needed for wheezing or shortness of breath.  . allopurinol (ZYLOPRIM) 100 MG tablet   . aspirin 81 MG chewable tablet Chew 81 mg by mouth daily.  . beclomethasone (QVAR) 80 MCG/ACT inhaler Inhale 2 puffs into the lungs 2 (two) times daily.  Marland Kitchen  fluticasone (FLONASE) 50 MCG/ACT nasal spray   . levothyroxine (SYNTHROID, LEVOTHROID) 125 MCG tablet 125 mcg daily.  Marland Kitchen LORazepam (ATIVAN) 1 MG tablet Take 1 tablet (1 mg total) by mouth 3 (three) times daily.  . pantoprazole (PROTONIX) 40 MG tablet 40 mg daily.  . rivaroxaban (XARELTO) 20 MG TABS tablet Take 20 mg by mouth daily with supper.  . sertraline (ZOLOFT) 100 MG tablet Take 50 mg by mouth daily.  . Tiotropium Bromide-Olodaterol (STIOLTO RESPIMAT IN) Inhale 1 puff into the lungs daily.  . traZODone (DESYREL) 150 MG tablet 150 mg at bedtime.  Marland Kitchen azithromycin (ZITHROMAX Z-PAK) 250 MG tablet Use as directed by physician  . meclizine (ANTIVERT) 12.5 MG tablet   . montelukast (SINGULAIR) 10 MG tablet Take 10 mg by mouth.  . naproxen (NAPROSYN) 375 MG tablet 375 mg daily as needed.  . ranitidine (ZANTAC) 300 MG capsule Take 300 mg by mouth.  . simvastatin (ZOCOR) 40 MG tablet Take 40 mg by mouth.  . [DISCONTINUED] azithromycin (ZITHROMAX Z-PAK) 250 MG tablet Use as directed by physician   No facility-administered encounter medications on file as of 09/12/2015.    Allergies  Allergen Reactions  . Contrast Media [Iodinated Diagnostic Agents] Shortness Of Breath  . Sulfa Antibiotics Hives    Current Medications, Allergies, Past Medical History, Past Surgical History, Family History, and Social History were reviewed in [employer] record.   Review of Systems           All symptoms NEG except where BOLDED >>  Constitutional:  F/C/S, fatigue, anorexia, unexpected weight change. HEENT:  HA, visual changes, hearing loss, earache, nasal symptoms, sore throat, mouth sores, hoarseness. Resp:  cough, sputum, hemoptysis; SOB, tightness, wheezing. Cardio:  CP, palpit, DOE, orthopnea, edema. GI:  N/V/D/C, blood in stool; reflux, abd pain, distention, gas. GU:  dysuria, freq, urgency, hematuria, flank pain, voiding difficulty. MS:  joint pain, swelling, tenderness, decr ROM; neck pain, back pain, etc. Neuro:  HA, tremors, seizures, dizziness, syncope, weakness, numbness, gait abn. Skin:  suspicious lesions or skin rash. Heme:  adenopathy, bruising, bleeding. Psyche:  confusion, agitation, sleep disturbance, hallucinations, anxiety, depression suicidal.   Objective:   Physical Exam    Vital Signs:  Reviewed...  General:  WD, WN, 69 y/o WF in NAD; alert & oriented; pleasant & cooperative... HEENT:  [place]/AT; Conjunctiva- pink, Sclera- nonicteric, EOM-wnl, PERRLA, EACs-clear, TMs-wnl; NOSE-clear; THROAT-clear & wnl. Neck:  Supple w/ fair ROM; no JVD; normal carotid impulses w/o bruits; no thyromegaly or nodules palpated; no lymphadenopathy. Chest:  Clear to P & A; without wheezes, rales, or rhonchi heard. Heart:  Regular Rhythm; norm S1 & S2 without murmurs, rubs, or gallops detected. Abdomen:  Soft & nontender- no guarding or rebound; normal bowel sounds; no organomegaly or masses palpated. Ext:  Normal ROM; without deformities, +arthritic changes; no varicose veins,+ venous insuffic, tr edema;  Pulses intact w/o bruits. Neuro:  CNs II-XII intact; motor testing normal; sensory testing normal; gait normal & balance OK. Derm:  No lesions noted; no rash etc. Lymph:  No cervical, supraclavicular, axillary, or inguinal adenopathy palpated.   Assessment:      IMP >>    Dyspnea- likely multifactorial w/ anxiety, chest wall factors, reflux playing a roll...     Hx  Asthma    Hx CHF    GERD    Anxiety/ Depression    Other medical issues as noted...  PLAN >>  05/04/15> She is much improved on Stiolto 1/d, Qvar80-2spBid,  Protonix40, and Lorazepam1mg Tid... Rec to continue same for now and we will plan to slowly wean over time... 09/12/15> We wrote for a ZPak & some refills for prn use; she will continue w/ the Stiolto, Lorazepam, and Zoloft; we reviewed the need for a progressive exercise program...      Plan:     Patient's Medications  New Prescriptions   AZITHROMYCIN (ZITHROMAX Z-PAK) 250 MG TABLET    Use as directed by physician  Previous Medications   ALBUTEROL (PROVENTIL HFA;VENTOLIN HFA) 108 (90 BASE) MCG/ACT INHALER    Inhale 2 puffs into the lungs every 6 (six) hours as needed for wheezing or shortness of breath.   ALLOPURINOL (ZYLOPRIM) 100 MG TABLET       ASPIRIN 81 MG CHEWABLE TABLET    Chew 81 mg by mouth daily.   BECLOMETHASONE (QVAR) 80 MCG/ACT INHALER    Inhale 2 puffs into the lungs 2 (two) times daily.   FLUTICASONE (FLONASE) 50 MCG/ACT NASAL SPRAY       LEVOTHYROXINE (SYNTHROID, LEVOTHROID) 125 MCG TABLET    125 mcg daily.   LORAZEPAM (ATIVAN) 1 MG TABLET    Take 1 tablet (1 mg total) by mouth 3 (three) times daily.   MECLIZINE (ANTIVERT) 12.5 MG TABLET       MONTELUKAST (SINGULAIR) 10 MG TABLET    Take 10 mg by mouth.   NAPROXEN (NAPROSYN) 375 MG TABLET    375 mg daily as needed.   PANTOPRAZOLE (PROTONIX) 40 MG TABLET    40 mg daily.   RANITIDINE (ZANTAC) 300 MG CAPSULE    Take 300 mg by mouth.   RIVAROXABAN (XARELTO) 20 MG TABS TABLET    Take 20 mg by mouth daily with supper.   SERTRALINE (ZOLOFT) 100 MG TABLET    Take 50 mg by mouth daily.   SIMVASTATIN (ZOCOR) 40 MG TABLET    Take 40 mg by mouth.   TIOTROPIUM BROMIDE-OLODATEROL (STIOLTO RESPIMAT IN)    Inhale 1 puff into the lungs daily.   TRAZODONE (DESYREL) 150 MG TABLET    150 mg at bedtime.  Modified Medications   No medications on file  Discontinued Medications   No  medications on file

## 2015-09-12 NOTE — Patient Instructions (Signed)
Today we updated your med list in our EPIC system...    Continue your current medications the same...  Continue the Stiolto daily, and the [employer] rescue inhaler as needed...  For ALLERGY symptoms> use the OTC Claritin 7 FLONASE nasal spray...  For the congestion> use the OTC MUCINEX 600mg - 1 to 2 tabs twice daily w/ fluids...  For the cough> use OTC DELSYM 2 tsp twice daily as needed...  We wrote for [facility] ZPak- use as directed for infection...  Call for any questions...  Let's plan a follow up visit in 74mo, sooner if needed for problems.Marland KitchenMarland Kitchen

## 2015-09-18 ENCOUNTER — Telehealth: Payer: Self-pay | Admitting: Pulmonary Disease

## 2015-09-18 NOTE — Telephone Encounter (Signed)
No samples of xarelto in office. lmtcb X1 to make pt aware.

## 2015-09-20 NOTE — Telephone Encounter (Signed)
Pt returned call let her know that we didn't have samples.Stanley A Dalton]

## 2015-10-13 ENCOUNTER — Other Ambulatory Visit: Payer: Self-pay | Admitting: Pulmonary Disease

## 2015-10-16 NOTE — Telephone Encounter (Signed)
Refill Ativan Last refill: 04/04/15 Last OV: 09/12/15 Next OV: 03/11/16  Ok to refill?

## 2015-10-17 NOTE — Telephone Encounter (Signed)
Per SN, Ok to refill with 5 additional refills

## 2015-10-24 DIAGNOSIS — Z23 Encounter for immunization: Secondary | ICD-10-CM | POA: Diagnosis not present

## 2015-11-02 DIAGNOSIS — E785 Hyperlipidemia, unspecified: Secondary | ICD-10-CM | POA: Diagnosis not present

## 2015-11-02 DIAGNOSIS — Z79899 Other long term (current) drug therapy: Secondary | ICD-10-CM | POA: Diagnosis not present

## 2015-11-02 DIAGNOSIS — E559 Vitamin D deficiency, unspecified: Secondary | ICD-10-CM | POA: Diagnosis not present

## 2015-11-02 DIAGNOSIS — M109 Gout, unspecified: Secondary | ICD-10-CM | POA: Diagnosis not present

## 2015-11-07 DIAGNOSIS — Z1231 Encounter for screening mammogram for malignant neoplasm of breast: Secondary | ICD-10-CM | POA: Diagnosis not present

## 2015-11-07 DIAGNOSIS — Z23 Encounter for immunization: Secondary | ICD-10-CM | POA: Diagnosis not present

## 2015-11-07 DIAGNOSIS — I10 Essential (primary) hypertension: Secondary | ICD-10-CM | POA: Diagnosis not present

## 2015-11-07 DIAGNOSIS — Z1212 Encounter for screening for malignant neoplasm of rectum: Secondary | ICD-10-CM | POA: Diagnosis not present

## 2015-11-07 DIAGNOSIS — M62830 Muscle spasm of back: Secondary | ICD-10-CM | POA: Diagnosis not present

## 2015-11-07 DIAGNOSIS — K219 Gastro-esophageal reflux disease without esophagitis: Secondary | ICD-10-CM | POA: Diagnosis not present

## 2015-11-07 DIAGNOSIS — E785 Hyperlipidemia, unspecified: Secondary | ICD-10-CM | POA: Diagnosis not present

## 2015-11-07 DIAGNOSIS — Z0000 Encounter for general adult medical examination without abnormal findings: Secondary | ICD-10-CM | POA: Diagnosis not present

## 2015-11-07 DIAGNOSIS — M797 Fibromyalgia: Secondary | ICD-10-CM | POA: Diagnosis not present

## 2015-11-07 DIAGNOSIS — E039 Hypothyroidism, unspecified: Secondary | ICD-10-CM | POA: Diagnosis not present

## 2015-11-07 DIAGNOSIS — M109 Gout, unspecified: Secondary | ICD-10-CM | POA: Diagnosis not present

## 2015-11-07 DIAGNOSIS — M8588 Other specified disorders of bone density and structure, other site: Secondary | ICD-10-CM | POA: Diagnosis not present

## 2015-11-24 DIAGNOSIS — M8589 Other specified disorders of bone density and structure, multiple sites: Secondary | ICD-10-CM | POA: Diagnosis not present

## 2015-11-24 DIAGNOSIS — Z1231 Encounter for screening mammogram for malignant neoplasm of breast: Secondary | ICD-10-CM | POA: Diagnosis not present

## 2015-12-05 DIAGNOSIS — Z1211 Encounter for screening for malignant neoplasm of colon: Secondary | ICD-10-CM | POA: Diagnosis not present

## 2015-12-05 DIAGNOSIS — K5900 Constipation, unspecified: Secondary | ICD-10-CM | POA: Diagnosis not present

## 2015-12-05 DIAGNOSIS — R1030 Lower abdominal pain, unspecified: Secondary | ICD-10-CM | POA: Diagnosis not present

## 2015-12-05 DIAGNOSIS — K219 Gastro-esophageal reflux disease without esophagitis: Secondary | ICD-10-CM | POA: Diagnosis not present

## 2015-12-12 ENCOUNTER — Telehealth: Payer: Self-pay | Admitting: Pulmonary Disease

## 2015-12-12 NOTE — Telephone Encounter (Signed)
Called and spoke with [place] at [facility].  Leroy Sea stated that they spoke with Chantel and she advised them that we have received the request for Lorazepam that was sent to Dr. Lenna Gilford.  They are requesting that we send a prescription to them.  Advised them that patient had Lorazepam refilled at [facility] on 10/25 with 5 refills. They said that patient is requesting that we provide 90 day supply through the mail service [facility]).  Tammi Sou that I will check with Dr. Lenna Gilford and Dr. Jeannine Kitten nurse to see if they have this request and will fax RX if Dr. Lenna Gilford approves.

## 2015-12-13 MED ORDER — LORAZEPAM 1 MG PO TABS: 0.5000 mg | ORAL_TABLET | Freq: Three times a day (TID) | ORAL | Status: DC | PRN

## 2015-12-13 NOTE — Telephone Encounter (Signed)
Per SN-  Ok to refill rx. Lorazepam 0.5mg -1mg  tab PO TID prn for anxiety with 1 refill.   LMTCB for pt. I have faxed rx to [place].

## 2015-12-14 DIAGNOSIS — I4891 Unspecified atrial fibrillation: Secondary | ICD-10-CM | POA: Diagnosis not present

## 2015-12-14 DIAGNOSIS — Z86711 Personal history of pulmonary embolism: Secondary | ICD-10-CM | POA: Diagnosis not present

## 2015-12-14 NOTE — Telephone Encounter (Signed)
Pt aware.

## 2015-12-19 DIAGNOSIS — Z1211 Encounter for screening for malignant neoplasm of colon: Secondary | ICD-10-CM | POA: Diagnosis not present

## 2015-12-19 DIAGNOSIS — K5900 Constipation, unspecified: Secondary | ICD-10-CM | POA: Diagnosis not present

## 2016-01-03 DIAGNOSIS — D126 Benign neoplasm of colon, unspecified: Secondary | ICD-10-CM | POA: Diagnosis not present

## 2016-01-03 DIAGNOSIS — Z1211 Encounter for screening for malignant neoplasm of colon: Secondary | ICD-10-CM | POA: Diagnosis not present

## 2016-01-03 DIAGNOSIS — D128 Benign neoplasm of rectum: Secondary | ICD-10-CM | POA: Diagnosis not present

## 2016-01-08 DIAGNOSIS — M109 Gout, unspecified: Secondary | ICD-10-CM | POA: Diagnosis not present

## 2016-01-08 DIAGNOSIS — Z6825 Body mass index (BMI) 25.0-25.9, adult: Secondary | ICD-10-CM | POA: Diagnosis not present

## 2016-01-08 DIAGNOSIS — Z79899 Other long term (current) drug therapy: Secondary | ICD-10-CM | POA: Diagnosis not present

## 2016-01-08 DIAGNOSIS — E785 Hyperlipidemia, unspecified: Secondary | ICD-10-CM | POA: Diagnosis not present

## 2016-01-08 DIAGNOSIS — I10 Essential (primary) hypertension: Secondary | ICD-10-CM | POA: Diagnosis not present

## 2016-01-08 DIAGNOSIS — M810 Age-related osteoporosis without current pathological fracture: Secondary | ICD-10-CM | POA: Diagnosis not present

## 2016-01-08 DIAGNOSIS — J309 Allergic rhinitis, unspecified: Secondary | ICD-10-CM | POA: Diagnosis not present

## 2016-01-08 DIAGNOSIS — F418 Other specified anxiety disorders: Secondary | ICD-10-CM | POA: Diagnosis not present

## 2016-01-08 DIAGNOSIS — E039 Hypothyroidism, unspecified: Secondary | ICD-10-CM | POA: Diagnosis not present

## 2016-01-08 DIAGNOSIS — K219 Gastro-esophageal reflux disease without esophagitis: Secondary | ICD-10-CM | POA: Diagnosis not present

## 2016-01-08 DIAGNOSIS — Z86711 Personal history of pulmonary embolism: Secondary | ICD-10-CM | POA: Diagnosis not present

## 2016-03-11 ENCOUNTER — Encounter: Payer: Self-pay | Admitting: Pulmonary Disease

## 2016-03-11 ENCOUNTER — Ambulatory Visit (INDEPENDENT_AMBULATORY_CARE_PROVIDER_SITE_OTHER): Payer: Medicare Other | Admitting: Pulmonary Disease

## 2016-03-11 ENCOUNTER — Ambulatory Visit (INDEPENDENT_AMBULATORY_CARE_PROVIDER_SITE_OTHER)
Admission: RE | Admit: 2016-03-11 | Discharge: 2016-03-11 | Disposition: A | Discharge status: Home / Self Care | Payer: Medicare Other | Source: Ambulatory Visit | Attending: Pulmonary Disease | Admitting: Pulmonary Disease

## 2016-03-11 VITALS — BP 116/70 | HR 75 | Temp 97.6°F | Ht 70.0 in | Wt 162.4 lb

## 2016-03-11 DIAGNOSIS — J387 Other diseases of larynx: Secondary | ICD-10-CM | POA: Diagnosis not present

## 2016-03-11 DIAGNOSIS — R0600 Dyspnea, unspecified: Secondary | ICD-10-CM | POA: Diagnosis not present

## 2016-03-11 DIAGNOSIS — Z8709 Personal history of other diseases of the respiratory system: Secondary | ICD-10-CM

## 2016-03-11 DIAGNOSIS — K219 Gastro-esophageal reflux disease without esophagitis: Secondary | ICD-10-CM

## 2016-03-11 DIAGNOSIS — F411 Generalized anxiety disorder: Secondary | ICD-10-CM

## 2016-03-11 DIAGNOSIS — J9811 Atelectasis: Secondary | ICD-10-CM | POA: Diagnosis not present

## 2016-03-11 MED ORDER — HYDROCODONE-HOMATROPINE 5-1.5 MG/5ML PO SYRP: 5.0000 mL | ORAL_SOLUTION | Freq: Four times a day (QID) | ORAL | Status: DC | PRN

## 2016-03-11 NOTE — Patient Instructions (Signed)
Today we updated your med list in our EPIC system...    Continue your current medications the same...  Today we did a follow up CXR...    We will contact you w/ the results when available...   Remember to use cough drops, throat lozenges, OTC DELSYM, & the TRAMADOL to help the cough...  We wrote a new prescription for HYCODAN cough syrup-- one tsp every Marmet as needed...  Call for any questions...  Let's plan a follow up visit in ~47mo, sooner if needed for problems.Marland KitchenMarland Kitchen

## 2016-03-11 NOTE — Progress Notes (Addendum)
Subjective:     Patient ID: Bailey Ashley, female   DOB: 07-Aug-1946, 70 y.o.   MRN: [id number]  HPI ~  70 y/o WF referred for eval of dyspnea> mostly c/o DOE & she's very sedentary/deconditioned, also notes trouble getting a deep breath or getting enough air "IN"; Spirometry & oxygenation OK; she also has signif reflux x yrs and placed on a strict antireflux regimen  ~  April 04, 2015:  Initial consult by Bailey Ashley>   38 y/o woman referred by Bailey Ashley of [facility] for evaluation of dyspnea; she is here w/ her daughter, Bailey Ashley she reports approx 40yr hx COPD/"asthma" w/ SOB/ DOE, some cough & wheezing, all worse over the last 51mo; she notes difficulty shopping, cleaning house, climbing stairs, "any activity"; says mild cough, no sput or hemoptysis, but notes "wheezing, chest tight";  She states that "I stay cold" she's on blood thinners and got herself a rolling walker "this helps"; on further questioning she notes that it seems hard to get the air "in", can't get a deep breath, not satisfied w/ her breathing & freq sighs; she admits to some anxiety- "I get worked up" she says, but she's been on Alprazolam 0.5mg  Tid & no better by her hx...     Smoking Hx> she started in her teens and quit in her 103s (over 31 yrs ago) but she did smoke up to 2ppd for <20 pack-yr smoking hx...    Reflux> she has hx signif reflux for yrs she says & takes Ranitadine for this...    Current meds>  Symbicort80-2spBid, ?Qvar80-2spBid, NEB w/ Duoneb Tid, Singulair10Qhs...    PMHx includes prev hx PE and cardiac hx (followed by Bailey Ashley in [place] but we don't have their records), ?CHF, she says they did ABG, CXR, ONO via Bailey Ashley pending...    FamHx reveals that her husb died 5 yrs ago w/ severe COPD & pancreatic cancer, he was an Chief Financial Officer in the [place] for 68yrs spent [place] in the middle [place], then [place] (very stressful for pt)...  EXAM reveals Afeb, VSS, O2sat=100% on RA;  Heent- neg, mallampati2;   Chest- clear w/o w/r/r;  Cor- RR w/o m/r/g;  Ext- w/o c/c/e...  CXR 03/16/15 in [place] showed heart wnl in size, lungs clear, NAD...   ABG by Ashley 03/16/15 showed pH=7.41, pCO2=39, pO2=67 on RA  Spirometry 04/04/15 showed FVC=3.32 (90%), FEV1=2.46 (88%), %1sec=74, mid-flows were wnl at 97%predicted...  Ambulatory oxygen test> O2sat= 98% on RA at rest w/ pulse=126/min;  She walked 1 lap w/ lowest O2sat=97% w/ pulse=97% (stopped due to pain & "giving out"  LABS from  reviewed> IgE level=9 and all RAST tests NEG;  IMP/PLAN>> we discussed her dyspnea, esp the sensation of "can't get the air in" and lack of objective abnormalities; she is no better on her Alprazolam- therefore suggest switch to LORAZEPAM 1mg Tid;  I feel it would be helpful to establish w/ a good counselor as well, in the meantime try the new Bailey Ashley called in by Bailey Ashley;  She may continue to use her NEBs as needed, and rec starting an exercise program;  In addition we will start PANTOPRAZOLE for her reflux symptoms and an antireflux regimen... Plan ROV 71mo for recheck...   ~  May 04, 2015:  39mo ROV w/ Bailey Ashley>  Bailey Ashley was seen 91mo ago w/ asthma, SOB w/ any activity, wheezing & tight in the chest, anxiety related to the passing of her husb several yrs ago; we adjusted her  meds in the interval to account for her insurance coverage to Bailey [facility] 1/d, Qvar80-2spBid, Lorazepam1mg Tid, ProtonixQd, and counseling (which she did not do); she reports much better on these meds- able to get deeper breath, feels more satisfied breathing, etc;  She is now ambulating w/ her 4pt cane & not the walker, doing some housework and in the yard... EXAM remains clear & she is in better spirits...  We discussed ROV in 7mo sooner if needed prn...   ~  September 12, 2015:  36mo ROV w/ Bailey Ashley>  Bailey Ashley returns feeling well & doing satis; she had a summer vacation in [place] visiting family- she did well x for some trouble w/ the pollen and one sudden syncopal spell,  seen by physician in [place] & nothing found, she denies similat prob before or since;  The pollen has recently caused some sneezing, cough w/ yellow mucus, & congestion- we discussed Rx w/ Bailey Ashley, Bailey Ashley/ Bailey Ashley, Mucinex, Bailey Ashley... We reviewed the following medical problems during today's office visit >>     Dyspnea- likely multifactorial w/ anxiety, chest wall factors, reflux playing a roll => improved on Rx & reminded about exercise...    Hx Asthma> on Stiolto- one puff daily & ProairHFA prn; doing satis w/o wheezing, tightness, etc...    Hx PE> on [facility] in [facility], we do not have their records...    Hx CHF> followed by [facility] in [facility], we do not have their records, Echo results, etc; pt not on any other cardiac meds....    Hyperlipid> on Simva40 per [facility] in [facility]    GERD> on Protonix40 & Ranitadine 300mg bid; we reviewed the need for antireflux regimen (elev HOB, NPO after dinner, etc)...    Hypothyroid> on Synthroid125 & followed by her Bailey Ashley...    Anxiety/ Depression> on Lorazepam 1mg - 1/2 to 1 tab Tid & Zoloft100- 1/2 tab daily; husb passed away 10yrs ago & very stressful for pt... EXAM reveals Afeb, VSS, O2sat=99% on RA;  Heent- neg, mallampati2;  Chest- clear w/o w/r/r;  Cor- RR w/o m/r/g;  Ext- w/o c/c/e... IMP/PLAN>>  We wrote for a Bailey Ashley 7 some refills for prn use; she will continue w/ the Stiolto, Lorazepam, and Bailey [facility]; we reviewed the need for a progressive exercise program; plan ROV in 19mo, sooner if needed prn...  ~  March 11, 2016:  59mo ROV & Bailey Ashley indicates that she is doing satis just noting some recent throat clearing, sl cough w/o sput, no CP x w/ coughing & denies much SOB x w/ exertion;  She does her ADLs ok, does her shopping ok (sometimes takes the motorized cart), and ambulates w/ her cane;  She needs incr exercise program but she complains that insurance won't pay for silver sneakers- I told her she needs to cover the cost of exercise program on her own, it's  THAT important (states the Y is too expensive "it's $40 per month!")... She remains on Stiolto daily, Singulair10, [facility], and AlbutHFA prn; she has the Ativan1mg  as well...     EXAM reveals Afeb, VSS, O2sat=98% on RA;  Heent- neg, mallampati2;  Chest- clear w/o w/r/r;  Cor- RR w/o m/r/g;  Abd- soft, nontender, neg;  Ext- w/o c/c/e;  Neuro- gait abn, otherw nonfocal...  CXR 03/11/16>  Norm heart size, tiny RUL nodule , biapical scarring, and mild atx in right mid lung zone; osteopenia in spine => I called pt & rec CT Chest for further eval...  IMP/PLAN>>  She appears stable but I  believe could be better if she increased her exercise program- clearly needs some supervision as in a silver sneaker program or joining the Y etc... CXR today shows some chr changes & ?tiny nodule in RUL area- suggest CT Chest for further eval... Rec to continue her Stiolto daily + the Singulair, Bailey [facility], & the AlbutHFA rescue inhaler prn... She can use cough drops, throat lozenges, Bailey Ashley & Tramadol as needed for the cough;  We wrote for some Hycodan for prn use at night...  ADDENDUM>  CT Chest 03/14/16 showed some atherosclerotic dis in LAD, calcif/granulomatous left suprahilar node, mild reticulonodular opac in lung apicies w/ nodular component ~7mm (corresponds to CXR) & likely represents scar tissue, left renal cyst, & benign splenic lymphangioma; Radiology felt that f/u imaging not nec...     Past Medical History  Diagnosis Date  . Asthma   . GERD (gastroesophageal reflux disease) >> on Protonix40 & Ranitadine 300 Bid   . Hyperlipidemia >> on Simva40 Qhs   . Hypothyroid >> on Levothy125   . Osteoporosis   . Depression >> on ZOLOFT100   . Psoriasis   . Ulnar neuropathy   . Constipation   . Hypertension >. Prev on Atenolol25- off now    . Bilateral hip pain >> on Tramadol50 prn   . Edema   . Tachycardia   . Congestive heart failure >> on ASA81; prev on Lasix40- 2daily & K20Bid- off now   . History of smoking    . Grief reaction >> on Ativan 1mg  tid prn   . Gout >> on Allopurinol100   . Pulmonary embolism    Past Surgical History  Procedure Laterality Date  . Nasal sinus surgery    . Cataract surgery      Outpatient Encounter Prescriptions as of 03/11/2016  Medication Sig  . albuterol (PROVENTIL HFA;VENTOLIN HFA) 108 (90 BASE) MCG/ACT inhaler Inhale 2 puffs into the lungs every 6 (six) hours as needed for wheezing or shortness of breath.  . allopurinol (ZYLOPRIM) 100 MG tablet   . aspirin 81 MG chewable tablet Chew 81 mg by mouth daily.  . baclofen (LIORESAL) 10 MG tablet Take 10 mg by mouth 2 (two) times daily as needed for muscle spasms.  . fluticasone (Bailey [facility]) 50 MCG/ACT nasal spray   . levothyroxine (Bailey Ashley, LEVOTHROID) 125 MCG tablet 125 mcg daily.  Marland Kitchen LORazepam (ATIVAN) 1 MG tablet Take 0.5-1 tablets (0.5-1 mg total) by mouth 3 (three) times daily as needed for anxiety.  . meclizine (ANTIVERT) 12.5 MG tablet   . montelukast (SINGULAIR) 10 MG tablet Take 10 mg by mouth.  . Multiple Vitamins-Minerals (MULTIVITAMIN WITH MINERALS) tablet Take 1 tablet by mouth daily.  . pantoprazole (PROTONIX) 40 MG tablet 40 mg daily.  . ranitidine (ZANTAC) 300 MG capsule Take 300 mg by mouth.  . rivaroxaban (Bailey Ashley) 20 MG TABS tablet Take 20 mg by mouth daily with supper.  . sertraline (Bailey Ashley) 100 MG tablet Take 50 mg by mouth daily.  . simvastatin (ZOCOR) 40 MG tablet Take 40 mg by mouth.  . Tiotropium Bromide-Olodaterol (STIOLTO RESPIMAT IN) Inhale 1 puff into the lungs daily.  . traMADol (ULTRAM) 50 MG tablet Take 50 mg by mouth at bedtime as needed.  . traZODone (DESYREL) 150 MG tablet 150 mg at bedtime.  Marland Kitchen HYDROcodone-homatropine (HYCODAN) 5-1.5 MG/5ML syrup Take 5 mLs by mouth every 6 (six) hours as needed for cough.  . [DISCONTINUED] azithromycin (ZITHROMAX Z-PAK) 250 MG tablet Use as directed by physician  . [DISCONTINUED] beclomethasone (  QVAR) 80 MCG/ACT inhaler Inhale 2 puffs into the  lungs 2 (two) times daily.  . [DISCONTINUED] naproxen (NAPROSYN) 375 MG tablet 375 mg daily as needed.   No facility-administered encounter medications on file as of 03/11/2016.    Allergies  Allergen Reactions  . Contrast Media [Iodinated Diagnostic Agents] Shortness Of Breath  . Sulfa Antibiotics Hives    Current Medications, Allergies, Past Medical History, Past Surgical History, Family History, and Social History were reviewed in [employer] record.   Review of Systems          All symptoms NEG except where BOLDED >>  Constitutional:  F/C/S, fatigue, anorexia, unexpected weight change. HEENT:  HA, visual changes, hearing loss, earache, nasal symptoms, sore throat, mouth sores, hoarseness. Resp:  cough, sputum, hemoptysis; SOB, tightness, wheezing. Cardio:  CP, palpit, DOE, orthopnea, edema. GI:  N/V/D/C, blood in stool; reflux, abd pain, distention, gas. GU:  dysuria, freq, urgency, hematuria, flank pain, voiding difficulty. MS:  joint pain, swelling, tenderness, decr ROM; neck pain, back pain, etc. Neuro:  HA, tremors, seizures, dizziness, syncope, weakness, numbness, gait abn. Skin:  suspicious lesions or skin rash. Heme:  adenopathy, bruising, bleeding. Psyche:  confusion, agitation, sleep disturbance, hallucinations, anxiety, depression suicidal.   Objective:   Physical Exam    Vital Signs:  Reviewed...  General:  WD, WN, 70 y/o WF in NAD; alert & oriented; pleasant & cooperative... HEENT:  [place]/AT; Conjunctiva- pink, Sclera- nonicteric, EOM-wnl, PERRLA, EACs-clear, TMs-wnl; NOSE-clear; THROAT-clear & wnl. Neck:  Supple w/ fair ROM; no JVD; normal carotid impulses w/o bruits; no thyromegaly or nodules palpated; no lymphadenopathy. Chest:  Clear to P & A; without wheezes, rales, or rhonchi heard. Heart:  Regular Rhythm; norm S1 & S2 without murmurs, rubs, or gallops detected. Abdomen:  Soft & nontender- no guarding or rebound; normal bowel sounds; no  organomegaly or masses palpated. Ext:  Normal ROM; without deformities, +arthritic changes; no varicose veins,+ venous insuffic, tr edema;  Pulses intact w/o bruits. Neuro:  CNs II-XII intact; motor testing normal; sensory testing normal; gait normal & balance OK. Derm:  No lesions noted; no rash etc. Lymph:  No cervical, supraclavicular, axillary, or inguinal adenopathy palpated.   Assessment:      IMP >>    Dyspnea- likely multifactorial w/ anxiety, chest wall factors, reflux playing a roll...     Hx Asthma    ?tiny RUL nodule on CXR 02/2016    Hx CHF    GERD    Anxiety/ Depression    Other medical issues as noted...  PLAN >>  05/04/15> She is much improved on Stiolto 1/d, Qvar80-2spBid, Protonix40, and Lorazepam1mg Tid... Rec to continue same for now and we will plan to slowly wean over time... 09/12/15> We wrote for a Bailey [facility] & some refills for prn use; she will continue w/ the Stiolto, Lorazepam, and Bailey Ashley; we reviewed the need for a progressive exercise program...  03/11/16> Appears stable on Stiolto, Singulair, Bailey Ashley, AlbutHFA but she is still not exercising (to help her DOE) & c/o sl cough & reflux; CXR here shows ?tiny RUL nodule & we will check CT; needs better exercise program and antireflux regimen...     Plan:     Patient's Medications  New Prescriptions   HYDROCODONE-HOMATROPINE (HYCODAN) 5-1.5 MG/5ML SYRUP    Take 5 mLs by mouth every 6 (six) hours as needed for cough.  Previous Medications   ALBUTEROL (PROVENTIL HFA;VENTOLIN HFA) 108 (90 BASE) MCG/ACT INHALER    Inhale 2 puffs  into the lungs every 6 (six) hours as needed for wheezing or shortness of breath.   ALLOPURINOL (ZYLOPRIM) 100 MG TABLET       ASPIRIN 81 MG CHEWABLE TABLET    Chew 81 mg by mouth daily.   BACLOFEN (LIORESAL) 10 MG TABLET    Take 10 mg by mouth 2 (two) times daily as needed for muscle spasms.   FLUTICASONE (Bailey Ashley) 50 MCG/ACT NASAL SPRAY       LEVOTHYROXINE (Bailey Ashley, LEVOTHROID) 125 MCG TABLET     125 mcg daily.   LORAZEPAM (ATIVAN) 1 MG TABLET    Take 0.5-1 tablets (0.5-1 mg total) by mouth 3 (three) times daily as needed for anxiety.   MECLIZINE (ANTIVERT) 12.5 MG TABLET       MONTELUKAST (SINGULAIR) 10 MG TABLET    Take 10 mg by mouth.   MULTIPLE VITAMINS-MINERALS (MULTIVITAMIN WITH MINERALS) TABLET    Take 1 tablet by mouth daily.   PANTOPRAZOLE (PROTONIX) 40 MG TABLET    40 mg daily.   RANITIDINE (ZANTAC) 300 MG CAPSULE    Take 300 mg by mouth.   RIVAROXABAN (Bailey Ashley) 20 MG TABS TABLET    Take 20 mg by mouth daily with supper.   SERTRALINE (Bailey Ashley) 100 MG TABLET    Take 50 mg by mouth daily.   SIMVASTATIN (ZOCOR) 40 MG TABLET    Take 40 mg by mouth.   TIOTROPIUM BROMIDE-OLODATEROL (STIOLTO RESPIMAT IN)    Inhale 1 puff into the lungs daily.   TRAMADOL (ULTRAM) 50 MG TABLET    Take 50 mg by mouth at bedtime as needed.   TRAZODONE (DESYREL) 150 MG TABLET    150 mg at bedtime.  Modified Medications   No medications on file  Discontinued Medications   AZITHROMYCIN (ZITHROMAX Z-PAK) 250 MG TABLET    Use as directed by physician   BECLOMETHASONE (QVAR) 80 MCG/ACT INHALER    Inhale 2 puffs into the lungs 2 (two) times daily.   NAPROXEN (NAPROSYN) 375 MG TABLET    375 mg daily as needed.

## 2016-03-12 ENCOUNTER — Other Ambulatory Visit: Payer: Self-pay | Admitting: Pulmonary Disease

## 2016-03-12 DIAGNOSIS — R911 Solitary pulmonary nodule: Secondary | ICD-10-CM

## 2016-03-14 ENCOUNTER — Ambulatory Visit (INDEPENDENT_AMBULATORY_CARE_PROVIDER_SITE_OTHER)
Admission: RE | Admit: 2016-03-14 | Discharge: 2016-03-14 | Disposition: A | Discharge status: Home / Self Care | Payer: Medicare Other | Source: Ambulatory Visit | Attending: Pulmonary Disease | Admitting: Pulmonary Disease

## 2016-03-14 DIAGNOSIS — R918 Other nonspecific abnormal finding of lung field: Secondary | ICD-10-CM | POA: Diagnosis not present

## 2016-03-14 DIAGNOSIS — R911 Solitary pulmonary nodule: Secondary | ICD-10-CM | POA: Diagnosis not present

## 2016-05-07 DIAGNOSIS — F329 Major depressive disorder, single episode, unspecified: Secondary | ICD-10-CM | POA: Diagnosis not present

## 2016-05-07 DIAGNOSIS — R55 Syncope and collapse: Secondary | ICD-10-CM | POA: Diagnosis not present

## 2016-05-07 DIAGNOSIS — J309 Allergic rhinitis, unspecified: Secondary | ICD-10-CM | POA: Diagnosis not present

## 2016-05-07 DIAGNOSIS — M109 Gout, unspecified: Secondary | ICD-10-CM | POA: Diagnosis not present

## 2016-05-07 DIAGNOSIS — E039 Hypothyroidism, unspecified: Secondary | ICD-10-CM | POA: Diagnosis not present

## 2016-05-07 DIAGNOSIS — K219 Gastro-esophageal reflux disease without esophagitis: Secondary | ICD-10-CM | POA: Diagnosis not present

## 2016-05-07 DIAGNOSIS — D171 Benign lipomatous neoplasm of skin and subcutaneous tissue of trunk: Secondary | ICD-10-CM | POA: Diagnosis not present

## 2016-05-07 DIAGNOSIS — Z681 Body mass index (BMI) 19 or less, adult: Secondary | ICD-10-CM | POA: Diagnosis not present

## 2016-05-07 DIAGNOSIS — Z79899 Other long term (current) drug therapy: Secondary | ICD-10-CM | POA: Diagnosis not present

## 2016-05-07 DIAGNOSIS — M797 Fibromyalgia: Secondary | ICD-10-CM | POA: Diagnosis not present

## 2016-05-07 DIAGNOSIS — Z1389 Encounter for screening for other disorder: Secondary | ICD-10-CM | POA: Diagnosis not present

## 2016-05-13 DIAGNOSIS — R55 Syncope and collapse: Secondary | ICD-10-CM | POA: Diagnosis not present

## 2016-05-13 DIAGNOSIS — I6521 Occlusion and stenosis of right carotid artery: Secondary | ICD-10-CM | POA: Diagnosis not present

## 2016-06-11 DIAGNOSIS — R55 Syncope and collapse: Secondary | ICD-10-CM | POA: Diagnosis not present

## 2016-06-21 DIAGNOSIS — H2512 Age-related nuclear cataract, left eye: Secondary | ICD-10-CM | POA: Diagnosis not present

## 2016-06-21 DIAGNOSIS — H40011 Open angle with borderline findings, low risk, right eye: Secondary | ICD-10-CM | POA: Diagnosis not present

## 2016-06-22 DIAGNOSIS — R55 Syncope and collapse: Secondary | ICD-10-CM | POA: Diagnosis not present

## 2016-06-24 DIAGNOSIS — E039 Hypothyroidism, unspecified: Secondary | ICD-10-CM | POA: Diagnosis not present

## 2016-06-24 DIAGNOSIS — I10 Essential (primary) hypertension: Secondary | ICD-10-CM | POA: Diagnosis not present

## 2016-06-24 DIAGNOSIS — I4891 Unspecified atrial fibrillation: Secondary | ICD-10-CM | POA: Diagnosis not present

## 2016-06-24 DIAGNOSIS — E785 Hyperlipidemia, unspecified: Secondary | ICD-10-CM | POA: Diagnosis not present

## 2016-07-01 DIAGNOSIS — Z86711 Personal history of pulmonary embolism: Secondary | ICD-10-CM | POA: Diagnosis not present

## 2016-07-01 DIAGNOSIS — I480 Paroxysmal atrial fibrillation: Secondary | ICD-10-CM | POA: Diagnosis not present

## 2016-08-12 ENCOUNTER — Other Ambulatory Visit: Payer: Self-pay | Admitting: Pulmonary Disease

## 2016-08-19 DIAGNOSIS — I4891 Unspecified atrial fibrillation: Secondary | ICD-10-CM | POA: Diagnosis not present

## 2016-08-19 DIAGNOSIS — Z86711 Personal history of pulmonary embolism: Secondary | ICD-10-CM | POA: Diagnosis not present

## 2016-08-28 DIAGNOSIS — E039 Hypothyroidism, unspecified: Secondary | ICD-10-CM | POA: Diagnosis not present

## 2016-08-28 DIAGNOSIS — I10 Essential (primary) hypertension: Secondary | ICD-10-CM | POA: Diagnosis not present

## 2016-08-28 DIAGNOSIS — Z86711 Personal history of pulmonary embolism: Secondary | ICD-10-CM | POA: Diagnosis not present

## 2016-08-28 DIAGNOSIS — J410 Simple chronic bronchitis: Secondary | ICD-10-CM | POA: Diagnosis not present

## 2016-08-28 DIAGNOSIS — I480 Paroxysmal atrial fibrillation: Secondary | ICD-10-CM | POA: Diagnosis not present

## 2016-09-04 ENCOUNTER — Encounter: Payer: Self-pay | Admitting: Pulmonary Disease

## 2016-09-04 ENCOUNTER — Ambulatory Visit (INDEPENDENT_AMBULATORY_CARE_PROVIDER_SITE_OTHER): Payer: Medicare Other | Admitting: Pulmonary Disease

## 2016-09-04 VITALS — BP 114/62 | HR 76 | Temp 97.3°F | Ht 70.0 in | Wt 174.4 lb

## 2016-09-04 DIAGNOSIS — Z8709 Personal history of other diseases of the respiratory system: Secondary | ICD-10-CM

## 2016-09-04 DIAGNOSIS — K219 Gastro-esophageal reflux disease without esophagitis: Secondary | ICD-10-CM

## 2016-09-04 DIAGNOSIS — R059 Cough, unspecified: Secondary | ICD-10-CM

## 2016-09-04 DIAGNOSIS — R05 Cough: Secondary | ICD-10-CM

## 2016-09-04 DIAGNOSIS — R0602 Shortness of breath: Secondary | ICD-10-CM | POA: Diagnosis not present

## 2016-09-04 DIAGNOSIS — Z23 Encounter for immunization: Secondary | ICD-10-CM | POA: Diagnosis not present

## 2016-09-04 DIAGNOSIS — J387 Other diseases of larynx: Secondary | ICD-10-CM

## 2016-09-04 DIAGNOSIS — F411 Generalized anxiety disorder: Secondary | ICD-10-CM | POA: Diagnosis not present

## 2016-09-04 MED ORDER — LORAZEPAM 1 MG PO TABS: 0.5000 mg | ORAL_TABLET | Freq: Three times a day (TID) | ORAL | 5 refills | Status: DC | PRN

## 2016-09-04 NOTE — Progress Notes (Signed)
Subjective:     Patient ID: Bailey Ashley, female   DOB: [birth year], 70 y.o.   MRN: [id number]  HPI 70 y/o WF referred for eval of dyspnea> mostly c/o DOE & she's very sedentary/deconditioned, also notes trouble getting a deep breath or getting enough air "IN"; Spirometry & oxygenation OK; she also has signif reflux x yrs and placed on a strict antireflux regimen  ~  April 04, 2015:  Initial consult by SN> by SN>   70 y/o woman referred by DrUppin of [facility] for evaluation of dyspnea; she is here w/ her daughter, [name] she reports approx 84yr hx COPD/"asthma" w/ SOB/ DOE, some cough & wheezing, all worse over the last 57mo; she notes difficulty shopping, cleaning house, climbing stairs, "any activity"; says mild cough, no sput or hemoptysis, but notes "wheezing, chest tight";  She states that "I stay cold" she's on blood thinners and got herself a rolling walker "this helps"; on further questioning she notes that it seems hard to get the air "in", can't get a deep breath, not satisfied w/ her breathing & freq sighs; she admits to some anxiety- "I get worked up" she says, but she's been on Alprazolam 0.5mg  Tid & no better by her hx...     Smoking Hx> she started in her teens and quit in her 62s (over 75 yrs ago) but she did smoke up to 2ppd for <20 pack-yr smoking hx...    Reflux> she has hx signif reflux for yrs she says & takes Ranitadine for this...    Current meds>  Symbicort80-2spBid, ?Qvar80-2spBid, NEB w/ Duoneb Tid, Singulair10Qhs...    PMHx includes prev hx PE and cardiac hx (followed by [facility] in [place] but we don't have their records), ?CHF, she says they did ABG, CXR, ONO via [place] pending...    FamHx reveals that her husb died 5 yrs ago w/ severe COPD & pancreatic cancer, he was an Chief Financial Officer in the [employer] for 54yrs spent [place] in the [place], then [place] home (very stressful for pt)...  EXAM reveals Afeb, VSS, O2sat=100% on RA;  Heent- neg, mallampati2;   Chest- clear w/o w/r/r;  Cor- RR w/o m/r/g;  Ext- w/o c/c/e...  CXR 03/16/15 in [place] showed heart wnl in size, lungs clear, NAD...   ABG by [facility] 03/16/15 showed pH=7.41, pCO2=39, pO2=67 on RA  Spirometry 04/04/15 showed FVC=3.32 (90%), FEV1=2.46 (88%), %1sec=74, mid-flows were wnl at 97%predicted...  Ambulatory oxygen test> O2sat= 98% on RA at rest w/ pulse=126/min;  She walked 1 lap w/ lowest O2sat=97% w/ pulse=97% (stopped due to pain & "giving out"  LABS from [place] reviewed> IgE level=9 and all RAST tests NEG;  IMP/PLAN>> we discussed her dyspnea, esp the sensation of "can't get the air in" and lack of objective abnormalities; she is no better on her Alprazolam- therefore suggest switch to LORAZEPAM 1mg Tid;  I feel it would be helpful to establish w/ a good counselor as well, in the meantime try the new Zoloft called in by DrUppin;  She may continue to use her NEBs as needed, and rec starting an exercise program;  In addition we will start PANTOPRAZOLE for her reflux symptoms and an antireflux regimen... Plan ROV 68mo for recheck...   ~  May 04, 2015:  68mo ROV w/ SN>  [name] was seen 73mo ago w/ asthma, SOB w/ any activity, wheezing & tight in the chest, anxiety related to the passing of her husb several yrs ago; we adjusted her meds in  the interval to account for her insurance coverage to [employer] 1/d, Qvar80-2spBid, Lorazepam1mg Tid, ProtonixQd, and counseling (which she did not do); she reports much better on these meds- able to get deeper breath, feels more satisfied breathing, etc;  She is now ambulating w/ her 4pt cane & not the walker, doing some housework and in the yard... EXAM remains clear & she is in better spirits...  We discussed ROV in 24mo sooner if needed prn...   ~  September 12, 2015:  68mo ROV w/ SN>  [name] returns feeling well & doing satis; she had a summer vacation in [place] visiting family- she did well x for some trouble w/ the pollen and one sudden syncopal spell,  seen by physician in [place] & nothing found, she denies similat prob before or since;  The pollen has recently caused some sneezing, cough w/ yellow mucus, & congestion- we discussed Rx w/ ZPak, Claritin/ Flonase, Mucinex, Delsym... We reviewed the following medical problems during today's office visit >>     Dyspnea- likely multifactorial w/ anxiety, chest wall factors, reflux playing a roll => improved on Rx & reminded about exercise...    Hx Asthma> on Stiolto- one puff daily & ProairHFA prn; doing satis w/o wheezing, tightness, etc...    Hx PE> on Xarelto per [facility] in [place], we do not have their records...    Hx CHF> followed by [facility] in [place], we do not have their records, Echo results, etc; pt not on any other cardiac meds....    Hyperlipid> on Simva40 per PCP in [place]    GERD> on Protonix40 & Ranitadine 300mg bid; we reviewed the need for antireflux regimen (elev HOB, NPO after dinner, etc)...    Hypothyroid> on Synthroid125 & followed by her PCP...    Anxiety/ Depression> on Lorazepam 1mg - 1/2 to 1 tab Tid & Zoloft100- 1/2 tab daily; husb passed away 48yrs ago & very stressful for pt... EXAM reveals Afeb, VSS, O2sat=99% on RA;  Heent- neg, mallampati2;  Chest- clear w/o w/r/r;  Cor- RR w/o m/r/g;  Ext- w/o c/c/e... IMP/PLAN>>  We wrote for a ZPak 7 some refills for prn use; she will continue w/ the Stiolto, Lorazepam, and Zoloft; we reviewed the need for a progressive exercise program; plan ROV in 55mo, sooner if needed prn...  ~  March 11, 2016:  68mo ROV & [name] indicates that she is doing satis just noting some recent throat clearing, sl cough w/o sput, no CP x w/ coughing & denies much SOB x w/ exertion;  She does her ADLs ok, does her shopping ok (sometimes takes the motorized cart), and ambulates w/ her cane;  She needs incr exercise program but she complains that insurance won't pay for silver sneakers- I told her she needs to cover the cost of exercise program on her own, it's  THAT important (states the Y is too expensive "it's $40 per month!")... She remains on Stiolto daily, Singulair10, Flonase, and AlbutHFA prn; she has the Ativan1mg  as well...     EXAM reveals Afeb, VSS, O2sat=98% on RA;  Heent- neg, mallampati2;  Chest- clear w/o w/r/r;  Cor- RR w/o m/r/g;  Abd- soft, nontender, neg;  Ext- w/o c/c/e;  Neuro- gait abn, otherw nonfocal...  CXR 03/11/16>  Norm heart size, tiny RUL nodule , biapical scarring, and mild atx in right mid lung zone; osteopenia in spine => I called pt & rec CT Chest for further eval...  IMP/PLAN>>  She appears stable but I believe could  be better if she increased her exercise program- clearly needs some supervision as in a silver sneaker program or joining the Y etc... CXR today shows some chr changes & ?tiny nodule in RUL area- suggest CT Chest for further eval... Rec to continue her Stiolto daily + the Singulair, Flonase, & the AlbutHFA rescue inhaler prn... She can use cough drops, throat lozenges, Delsym & Tramadol as needed for the cough;  We wrote for some Hycodan for prn use at night...  ADDENDUM>  CT Chest 03/14/16 showed some atherosclerotic dis in LAD, calcif/granulomatous left suprahilar node, mild reticulonodular opac in lung apicies w/ nodular component ~87mm (corresponds to CXR) & likely represents scar tissue, left renal cyst, & benign splenic lymphangioma; Radiology felt that f/u imaging not nec...   ~  September 04, 2016:  22mo ROV & pulmonary follow up visit> Ayvah notes a sl dry cough 7 wonders if one of her AFib meds might be making her cough (she is NOT on an ACE or ARB);  She continues on Stiolto-2sp/d, Singulair10, AlbutHFA rescue prn (hasn't needed), and Hycodan prn;  She also has Loazepam 1mg -1/2 to 1 tab tid prn, along w/ Zoloft & Trazadone... Her PCP is DrUppin in [place] her Cardiology team is there too- unfortubnately we do not have any notes from these providers in epic...  She reports that was was going to a local gym  but stopped when she had the onset of AFib; now cards has released her to return to exercise program & she will start back in the [employer] program...    Dyspnea- likely multifactorial w/ anxiety, chest wall factors, reflux playing a roll => improved on Rx w/ Lorazepam, Stiolto, Singulair, Mucinex, etc...    Hx Asthma> on Stiolto- one puff daily & ProairHFA prn; doing satis w/o wheezing, tightness, etc...    Hx PE> on Xarelto per [facility] in [place], we do not have their records...    New onset AFib, Hx CHF> followed by [facility] in [place], we do not have their records, Echo results, etc; currently taking ?Flecainide, Xarelto, [id number] & asked to get notes for [place] to scan into Epic.    Hyperlipid> on Simva40 per PCP in [place]    GERD> on Protonix40 & Ranitadine 300mg bid; we reviewed the need for antireflux regimen (elev HOB, NPO after dinner, etc)...    Hypothyroid> on Synthroid125 & followed by her PCP...    Anxiety/ Depression> on Lorazepam 1mg - 1/2 to 1 tab Tid & Zoloft100- 1/2 tab daily; husb passed away 72yrs ago & very stressful for pt... EXAM reveals Afeb, VSS, O2sat=100% on RA;  Heent- neg, mallampati2;  Chest- clear w/o w/r/r;  Cor- RR w/o m/r/g;  Ext- w/o c/c/e... IMP/PLAN>>  We refilled Lorazepam, and rec continuing Stiolto, Singulair, AlbutHFA, Mucinex, fluids, and Delsym... Call for any problems.    Past Medical History  Diagnosis Date  . Asthma   . GERD (gastroesophageal reflux disease) >> on Protonix40 & Ranitadine 300 Bid   . Hyperlipidemia >> on Simva40 Qhs   . Hypothyroid >> on Levothy125   . Osteoporosis   . Depression >> on ZOLOFT100   . Psoriasis   . Ulnar neuropathy   . Constipation   . Hypertension >. Prev on Atenolol25- off now    . Bilateral hip pain >> on Tramadol50 prn   . Edema   . Tachycardia   . Congestive heart failure >> on ASA81; prev on Lasix40- 2daily & K20Bid- off now   . History of  smoking   . Grief reaction >> on Ativan 1mg  tid prn   . Gout  >> on Allopurinol100   . Pulmonary embolism    Past Surgical History:  Procedure Laterality Date  . Cataract surgery    . NASAL SINUS SURGERY      Outpatient Encounter Prescriptions as of 09/04/2016  Medication Sig  . albuterol (PROVENTIL HFA;VENTOLIN HFA) 108 (90 BASE) MCG/ACT inhaler Inhale 2 puffs into the lungs every 6 (six) hours as needed for wheezing or shortness of breath.  . allopurinol (ZYLOPRIM) 100 MG tablet   . aspirin 81 MG chewable tablet Chew 81 mg by mouth daily.  . baclofen (LIORESAL) 10 MG tablet Take 10 mg by mouth 2 (two) times daily as needed for muscle spasms.  . fluticasone (FLONASE) 50 MCG/ACT nasal spray   . HYDROcodone-homatropine (HYCODAN) 5-1.5 MG/5ML syrup Take 5 mLs by mouth every 6 (six) hours as needed for cough.  . levothyroxine (SYNTHROID, LEVOTHROID) 125 MCG tablet 125 mcg daily.  Marland Kitchen LORazepam (ATIVAN) 1 MG tablet Take 0.5-1 tablets (0.5-1 mg total) by mouth 3 (three) times daily as needed for anxiety.  . meclizine (ANTIVERT) 12.5 MG tablet   . montelukast (SINGULAIR) 10 MG tablet Take 10 mg by mouth.  . Multiple Vitamins-Minerals (MULTIVITAMIN WITH MINERALS) tablet Take 1 tablet by mouth daily.  . pantoprazole (PROTONIX) 40 MG tablet 40 mg daily.  . ranitidine (ZANTAC) 300 MG capsule Take 300 mg by mouth.  . rivaroxaban (XARELTO) 20 MG TABS tablet Take 20 mg by mouth daily with supper.  . sertraline (ZOLOFT) 100 MG tablet Take 50 mg by mouth daily.  . simvastatin (ZOCOR) 40 MG tablet Take 40 mg by mouth.  . Tiotropium Bromide-Olodaterol (STIOLTO RESPIMAT IN) Inhale 1 puff into the lungs daily.  . traMADol (ULTRAM) 50 MG tablet Take 50 mg by mouth at bedtime as needed.  . traZODone (DESYREL) 150 MG tablet 150 mg at bedtime.  . [DISCONTINUED] LORazepam (ATIVAN) 1 MG tablet Take 0.5-1 tablets (0.5-1 mg total) by mouth 3 (three) times daily as needed for anxiety.   No facility-administered encounter medications on file as of 09/04/2016.     Allergies   Allergen Reactions  . Contrast Media [Iodinated Diagnostic Agents] Shortness Of Breath  . Sulfa Antibiotics Hives    Current Medications, Allergies, Past Medical History, Past Surgical History, Family History, and Social History were reviewed in [employer] record.   Review of Systems          All symptoms NEG except where BOLDED >>  Constitutional:  F/C/S, fatigue, anorexia, unexpected weight change. HEENT:  HA, visual changes, hearing loss, earache, nasal symptoms, sore throat, mouth sores, hoarseness. Resp:  cough, sputum, hemoptysis; SOB, tightness, wheezing. Cardio:  CP, palpit, DOE, orthopnea, edema. GI:  N/V/D/C, blood in stool; reflux, abd pain, distention, gas. GU:  dysuria, freq, urgency, hematuria, flank pain, voiding difficulty. MS:  joint pain, swelling, tenderness, decr ROM; neck pain, back pain, etc. Neuro:  HA, tremors, seizures, dizziness, syncope, weakness, numbness, gait abn. Skin:  suspicious lesions or skin rash. Heme:  adenopathy, bruising, bleeding. Psyche:  confusion, agitation, sleep disturbance, hallucinations, anxiety, depression suicidal.   Objective:   Physical Exam    Vital Signs:  Reviewed...   General:  WD, WN, 70 y/o WF in NAD; alert & oriented; pleasant & cooperative... HEENT:  [place]/AT; Conjunctiva- pink, Sclera- nonicteric, EOM-wnl, PERRLA, EACs-clear, TMs-wnl; NOSE-clear; THROAT-clear & wnl.  Neck:  Supple w/ fair ROM; no JVD; normal carotid  impulses w/o bruits; no thyromegaly or nodules palpated; no lymphadenopathy.  Chest:  Clear to P & A; without wheezes, rales, or rhonchi heard. Heart:  Regular Rhythm; norm S1 & S2 without murmurs, rubs, or gallops detected. Abdomen:  Soft & nontender- no guarding or rebound; normal bowel sounds; no organomegaly or masses palpated. Ext:  Normal ROM; without deformities, +arthritic changes; no varicose veins,+ venous insuffic, tr edema;  Pulses intact w/o bruits. Neuro:  CNs II-XII  intact; motor testing normal; sensory testing normal; gait normal & balance OK. Derm:  No lesions noted; no rash etc. Lymph:  No cervical, supraclavicular, axillary, or inguinal adenopathy palpated.   Assessment:      IMP >>    Dyspnea- likely multifactorial w/ anxiety, chest wall factors, reflux playing a roll...     Hx Asthma    ?tiny RUL nodule on CXR 02/2016    Hx CHF    GERD    Anxiety/ Depression    Other medical issues as noted...  PLAN >>  05/04/15> She is much improved on Stiolto 1/d, Qvar80-2spBid, Protonix40, and Lorazepam1mg Tid... Rec to continue same for now and we will plan to slowly wean over time... 09/12/15> We wrote for a ZPak & some refills for prn use; she will continue w/ the Stiolto, Lorazepam, and Zoloft; we reviewed the need for a progressive exercise program...  03/11/16> Appears stable on Stiolto, Singulair, Flonase, AlbutHFA but she is still not exercising (to help her DOE) & c/o sl cough & reflux; CXR here shows ?tiny RUL nodule & we will check CT; needs better exercise program and antireflux regimen... 09/04/16>   We refilled Lorazepam, and rec continuing Stiolto, Singulair, AlbutHFA, Mucinex, fluids, and Delsym... Call for any problems     Plan:     Patient's Medications  New Prescriptions   No medications on file  Previous Medications   ALBUTEROL (PROVENTIL HFA;VENTOLIN HFA) 108 (90 BASE) MCG/ACT INHALER    Inhale 2 puffs into the lungs every 6 (six) hours as needed for wheezing or shortness of breath.   ALLOPURINOL (ZYLOPRIM) 100 MG TABLET       ASPIRIN 81 MG CHEWABLE TABLET    Chew 81 mg by mouth daily.   BACLOFEN (LIORESAL) 10 MG TABLET    Take 10 mg by mouth 2 (two) times daily as needed for muscle spasms.   FLUTICASONE (FLONASE) 50 MCG/ACT NASAL SPRAY       HYDROCODONE-HOMATROPINE (HYCODAN) 5-1.5 MG/5ML SYRUP    Take 5 mLs by mouth every 6 (six) hours as needed for cough.   LEVOTHYROXINE (SYNTHROID, LEVOTHROID) 125 MCG TABLET    125 mcg daily.    MECLIZINE (ANTIVERT) 12.5 MG TABLET       MONTELUKAST (SINGULAIR) 10 MG TABLET    Take 10 mg by mouth.   MULTIPLE VITAMINS-MINERALS (MULTIVITAMIN WITH MINERALS) TABLET    Take 1 tablet by mouth daily.   PANTOPRAZOLE (PROTONIX) 40 MG TABLET    40 mg daily.   RANITIDINE (ZANTAC) 300 MG CAPSULE    Take 300 mg by mouth.   RIVAROXABAN (XARELTO) 20 MG TABS TABLET    Take 20 mg by mouth daily with supper.   SERTRALINE (ZOLOFT) 100 MG TABLET    Take 50 mg by mouth daily.   SIMVASTATIN (ZOCOR) 40 MG TABLET    Take 40 mg by mouth.   TIOTROPIUM BROMIDE-OLODATEROL (STIOLTO RESPIMAT IN)    Inhale 1 puff into the lungs daily.   TRAMADOL (ULTRAM) 50 MG TABLET  Take 50 mg by mouth at bedtime as needed.   TRAZODONE (DESYREL) 150 MG TABLET    150 mg at bedtime.  Modified Medications   Modified Medication Previous Medication   LORAZEPAM (ATIVAN) 1 MG TABLET LORazepam (ATIVAN) 1 MG tablet      Take 0.5-1 tablets (0.5-1 mg total) by mouth 3 (three) times daily as needed for anxiety.    Take 0.5-1 tablets (0.5-1 mg total) by mouth 3 (three) times daily as needed for anxiety.  Discontinued Medications   No medications on file

## 2016-09-04 NOTE — Patient Instructions (Signed)
Today we updated your med list in our EPIC system...    Continue your current medications the same...  We refilled your Lorazepam today...  Continue the Stiolto- 2 sprays once daily, Singulair & the Albuterol rescue inhaler as needed...  We mentioned using the following OTC meds as needed>>    MUCINEX 600mg  - take 1-2 tabs twice daily w/ extra fluids as needed for congestion & phlegm...    DELSYM 2 tsp twice daily as needed for cough...  Remember to bring all your meds to all your doctor visits...  Call for any questions...  Let's plan a follow up visit in 67mo, sooner if needed for problems.Marland KitchenMarland Kitchen

## 2016-09-11 DIAGNOSIS — I4891 Unspecified atrial fibrillation: Secondary | ICD-10-CM | POA: Diagnosis not present

## 2016-09-13 ENCOUNTER — Other Ambulatory Visit: Payer: Self-pay | Admitting: Pulmonary Disease

## 2016-10-15 DIAGNOSIS — Z6826 Body mass index (BMI) 26.0-26.9, adult: Secondary | ICD-10-CM | POA: Diagnosis not present

## 2016-10-15 DIAGNOSIS — J309 Allergic rhinitis, unspecified: Secondary | ICD-10-CM | POA: Diagnosis not present

## 2016-10-15 DIAGNOSIS — E663 Overweight: Secondary | ICD-10-CM | POA: Diagnosis not present

## 2016-10-15 DIAGNOSIS — J449 Chronic obstructive pulmonary disease, unspecified: Secondary | ICD-10-CM | POA: Diagnosis not present

## 2016-10-15 DIAGNOSIS — D171 Benign lipomatous neoplasm of skin and subcutaneous tissue of trunk: Secondary | ICD-10-CM | POA: Diagnosis not present

## 2016-10-22 DIAGNOSIS — D171 Benign lipomatous neoplasm of skin and subcutaneous tissue of trunk: Secondary | ICD-10-CM | POA: Diagnosis not present

## 2016-10-28 IMAGING — CT CT CHEST W/O CM
2 of 3 series · 15 of 36 positions shown, 18 images · non-contrast
Comparison: 03/11/2016 chest radiograph.

CLINICAL DATA: Questionable right upper lobe nodule on recent chest
radiograph.

EXAM:
CT CHEST WITHOUT CONTRAST
TECHNIQUE: Multidetector CT imaging of the chest was performed following the
standard protocol without IV contrast.

[Series 2: thorax · axial · 0.66mm/px · z∈[-278,-18]mm · 12 of 62 slices shown, 15 images]
[im 5/62  mediastinal]
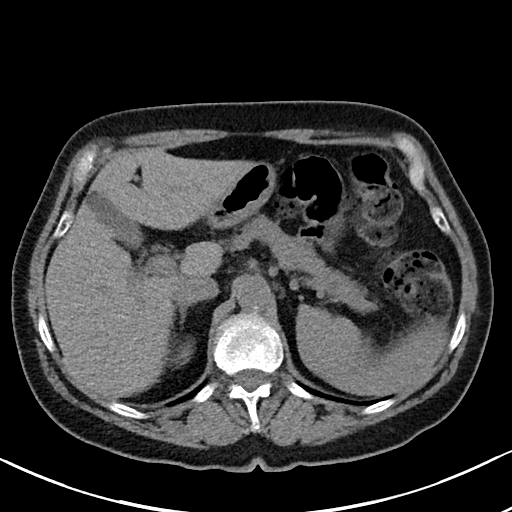
[im 5/62  lung]
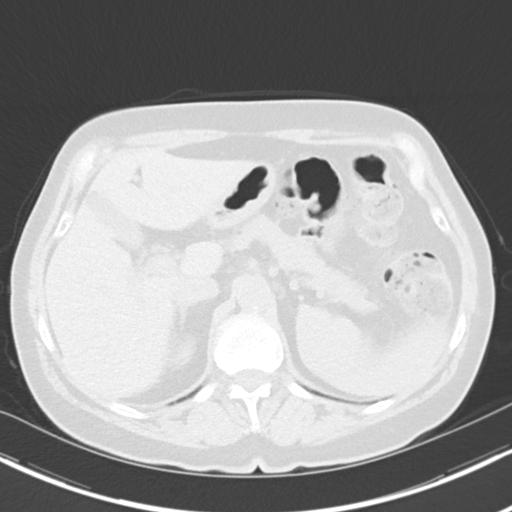
[im 10/62  lung]
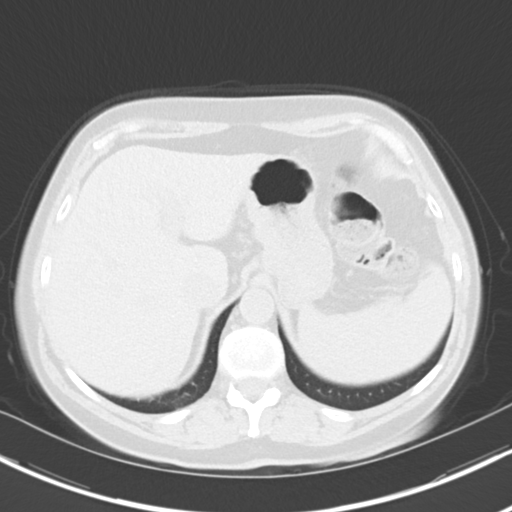
[im 14/62  lung]
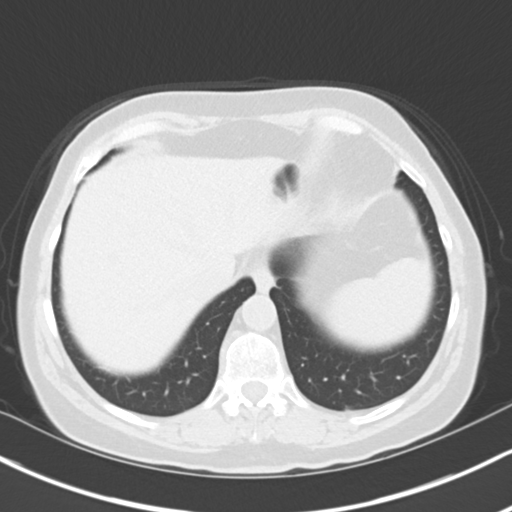
[im 19/62  lung]
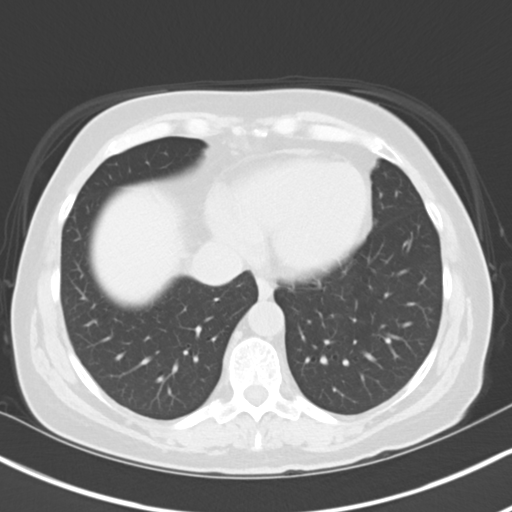
[im 23/62  mediastinal]
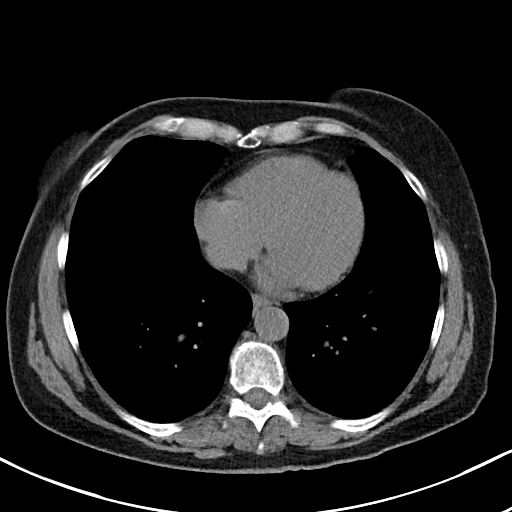
[im 23/62  lung]
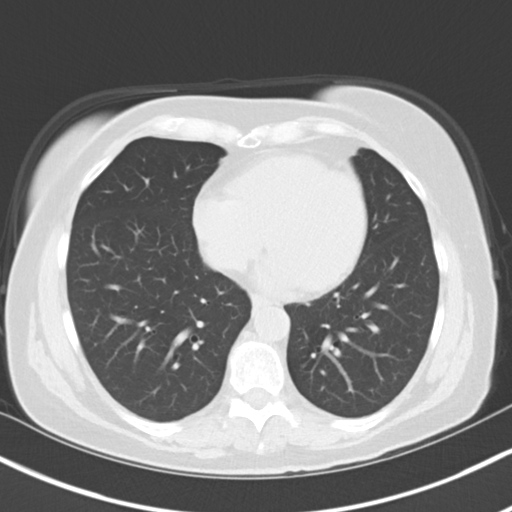
[im 28/62  lung]
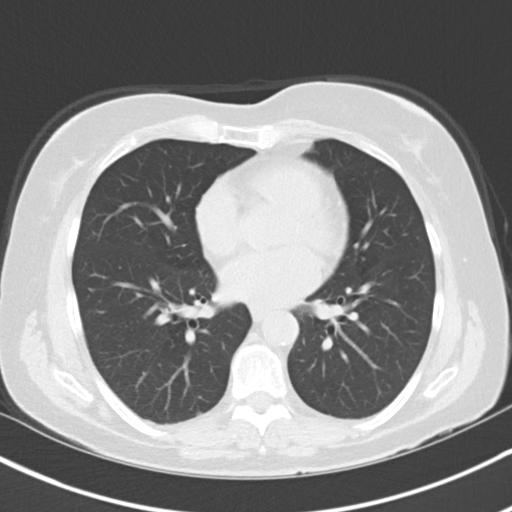
[im 34/62  lung]
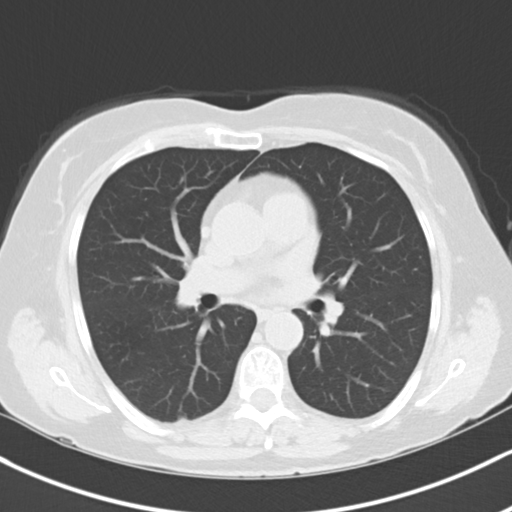
[im 39/62  lung]
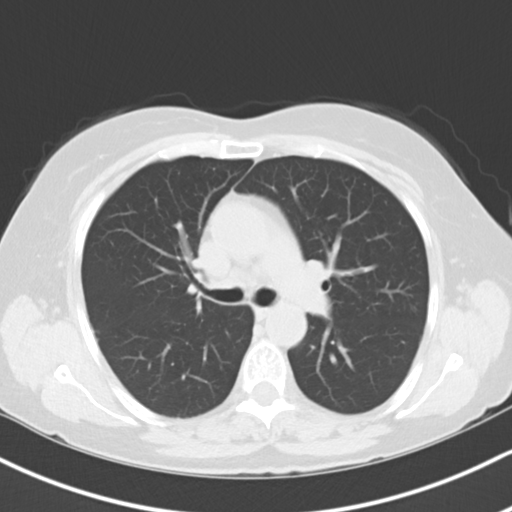
[im 43/62  mediastinal]
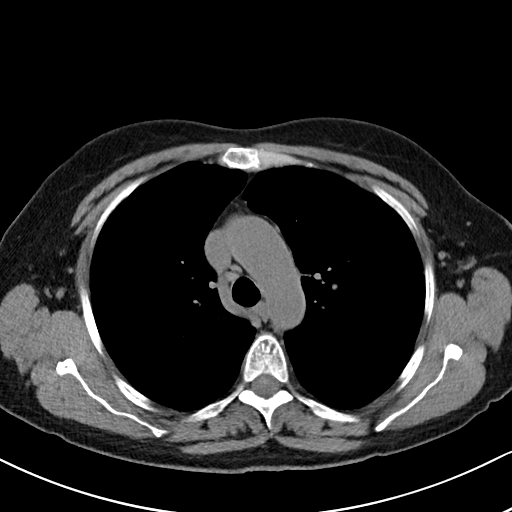
[im 43/62  lung]
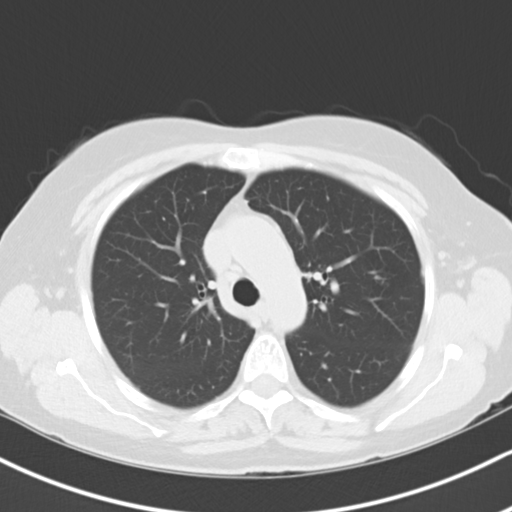
[im 48/62  lung]
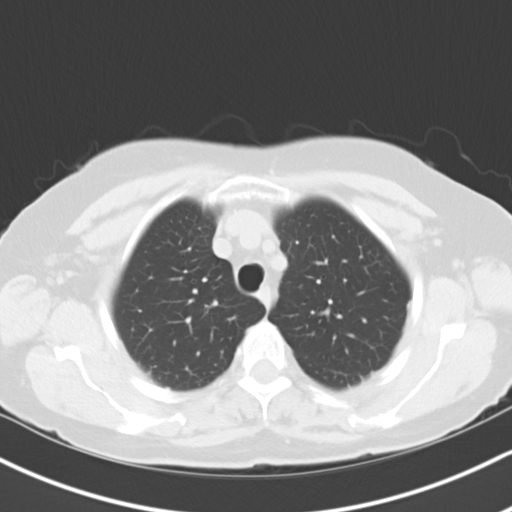
[im 52/62  lung]
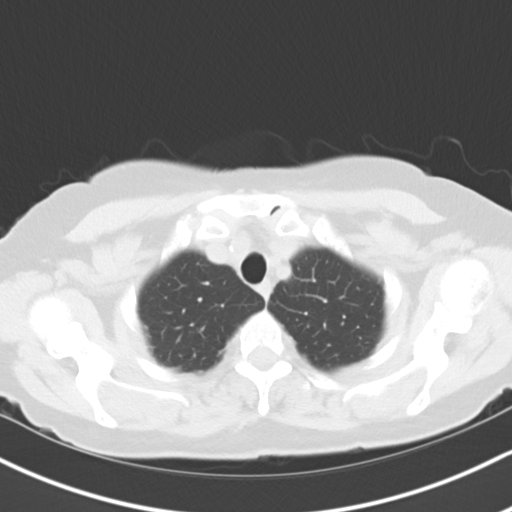
[im 57/62  lung]
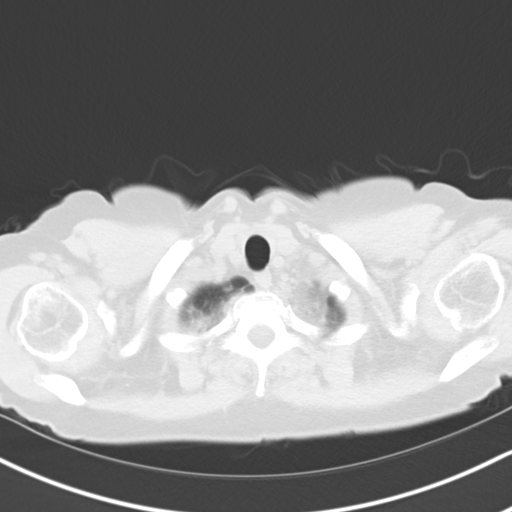

[Series 5: coronal · coronal · 0.60mm/px · 3 of 120 slices shown]
[im 24/120  lung]
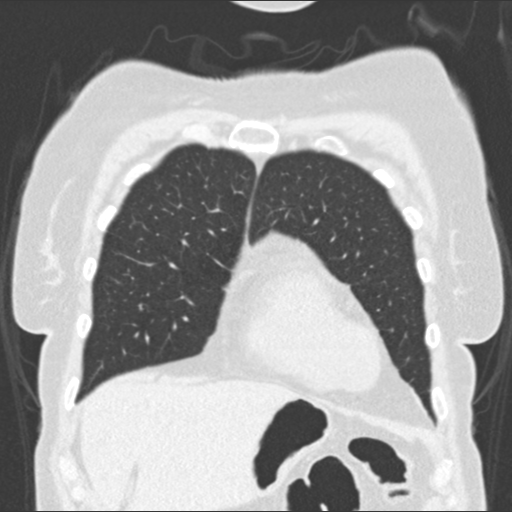
[im 48/120  lung]
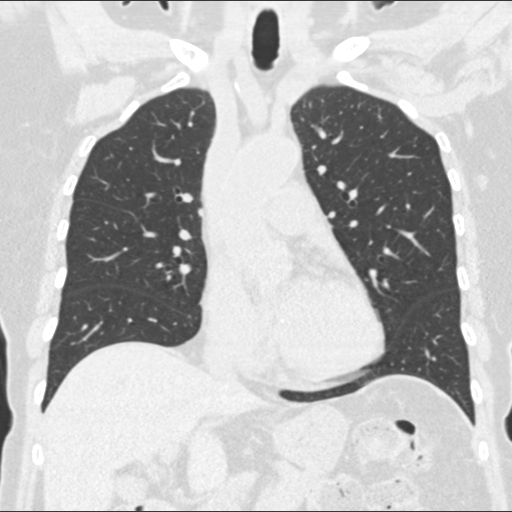
[im 72/120  lung]
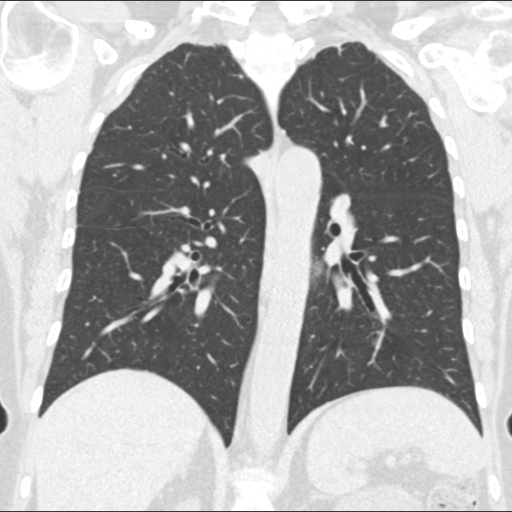

[15 of 36 positions shown; findings below may reference images not displayed]

FINDINGS: Mediastinum/Nodes: Normal heart size. Trace anterior pericardial
fluid/thickening. Left anterior descending coronary atherosclerosis.
Great vessels are normal in course and caliber. Thyroid appears
atrophic. Normal esophagus. No pathologically enlarged axillary,
mediastinal or gross hilar lymph nodes, noting limited sensitivity
for the detection of hilar adenopathy on this noncontrast study.
Coarsely calcified granulomatous left suprahilar node.

Lungs/Pleura: No pneumothorax. No pleural effusion. There are
symmetric mild reticulonodular opacities in the subpleural lung
apices, with the largest nodular opacity measuring 6 x 3 mm, average
diameter 5 mm (series 3/image 9), which correlates with the
radiographically apparent nodule. No acute consolidative airspace
disease or lung masses.

Upper abdomen: Partially visualized renal cyst in the upper left
kidney measuring at least 6.0 cm, the visualized portions of which
appear simple. Solitary 1.5 cm fluid density lesion in the lower
spleen.

Musculoskeletal: No aggressive appearing focal osseous lesions. Mild
degenerative changes in the thoracic spine.
IMPRESSION: 1. Symmetric mild reticulonodular opacities in the subpleural lung
apices, with the largest nodular opacity within average diameter of
5 mm, highly likely to represent benign pleural-parenchymal
scarring. No follow-up needed if patient is low-risk (and has no
known or suspected primary neoplasm). Non-contrast chest CT can be
considered in 12 months if patient is high-risk. This recommendation
follows the consensus statement: Guidelines for Management of
Incidental Pulmonary Nodules Detected on CT Images:From the
[HOSPITAL] 8514; published online before print
(10.1148/radiol.6674787881).
2. One vessel coronary atherosclerosis.
3. Solitary 1.5 cm simple cystic lesion in the lower spleen, likely
to represent a benign lesion such as a lymphangioma. No imaging
follow-up is required for this lesion. This recommendation follows
ACR consensus guidelines: White Paper of the ACR Incidental Findings
Committee II on Splenic and Nodal Findings. [HOSPITAL]

## 2016-11-04 DIAGNOSIS — D171 Benign lipomatous neoplasm of skin and subcutaneous tissue of trunk: Secondary | ICD-10-CM | POA: Diagnosis not present

## 2016-11-06 DIAGNOSIS — G4700 Insomnia, unspecified: Secondary | ICD-10-CM | POA: Diagnosis not present

## 2016-11-06 DIAGNOSIS — E039 Hypothyroidism, unspecified: Secondary | ICD-10-CM | POA: Diagnosis not present

## 2016-11-06 DIAGNOSIS — Z0000 Encounter for general adult medical examination without abnormal findings: Secondary | ICD-10-CM | POA: Diagnosis not present

## 2016-11-06 DIAGNOSIS — Z79899 Other long term (current) drug therapy: Secondary | ICD-10-CM | POA: Diagnosis not present

## 2016-11-06 DIAGNOSIS — E559 Vitamin D deficiency, unspecified: Secondary | ICD-10-CM | POA: Diagnosis not present

## 2016-11-06 DIAGNOSIS — Z124 Encounter for screening for malignant neoplasm of cervix: Secondary | ICD-10-CM | POA: Diagnosis not present

## 2016-11-06 DIAGNOSIS — M797 Fibromyalgia: Secondary | ICD-10-CM | POA: Diagnosis not present

## 2016-11-06 DIAGNOSIS — F418 Other specified anxiety disorders: Secondary | ICD-10-CM | POA: Diagnosis not present

## 2016-11-06 DIAGNOSIS — K219 Gastro-esophageal reflux disease without esophagitis: Secondary | ICD-10-CM | POA: Diagnosis not present

## 2016-11-06 DIAGNOSIS — M109 Gout, unspecified: Secondary | ICD-10-CM | POA: Diagnosis not present

## 2016-11-06 DIAGNOSIS — E785 Hyperlipidemia, unspecified: Secondary | ICD-10-CM | POA: Diagnosis not present

## 2016-11-06 DIAGNOSIS — J309 Allergic rhinitis, unspecified: Secondary | ICD-10-CM | POA: Diagnosis not present

## 2016-11-06 DIAGNOSIS — Z1231 Encounter for screening mammogram for malignant neoplasm of breast: Secondary | ICD-10-CM | POA: Diagnosis not present

## 2016-11-06 DIAGNOSIS — I10 Essential (primary) hypertension: Secondary | ICD-10-CM | POA: Diagnosis not present

## 2016-11-06 DIAGNOSIS — Z6826 Body mass index (BMI) 26.0-26.9, adult: Secondary | ICD-10-CM | POA: Diagnosis not present

## 2016-11-27 DIAGNOSIS — I10 Essential (primary) hypertension: Secondary | ICD-10-CM | POA: Diagnosis not present

## 2016-11-27 DIAGNOSIS — E02 Subclinical iodine-deficiency hypothyroidism: Secondary | ICD-10-CM | POA: Diagnosis not present

## 2016-11-27 DIAGNOSIS — I480 Paroxysmal atrial fibrillation: Secondary | ICD-10-CM | POA: Diagnosis not present

## 2016-11-27 DIAGNOSIS — Z86711 Personal history of pulmonary embolism: Secondary | ICD-10-CM | POA: Diagnosis not present

## 2016-11-27 DIAGNOSIS — J410 Simple chronic bronchitis: Secondary | ICD-10-CM | POA: Diagnosis not present

## 2016-12-03 DIAGNOSIS — Z1231 Encounter for screening mammogram for malignant neoplasm of breast: Secondary | ICD-10-CM | POA: Diagnosis not present

## 2016-12-11 DIAGNOSIS — I4891 Unspecified atrial fibrillation: Secondary | ICD-10-CM | POA: Diagnosis not present

## 2016-12-22 ENCOUNTER — Other Ambulatory Visit: Payer: Self-pay | Admitting: Pulmonary Disease

## 2017-01-20 DIAGNOSIS — K635 Polyp of colon: Secondary | ICD-10-CM | POA: Diagnosis not present

## 2017-01-20 DIAGNOSIS — Z86010 Personal history of colonic polyps: Secondary | ICD-10-CM | POA: Diagnosis not present

## 2017-01-20 DIAGNOSIS — Z1211 Encounter for screening for malignant neoplasm of colon: Secondary | ICD-10-CM | POA: Diagnosis not present

## 2017-01-20 DIAGNOSIS — D122 Benign neoplasm of ascending colon: Secondary | ICD-10-CM | POA: Diagnosis not present

## 2017-02-06 DIAGNOSIS — E785 Hyperlipidemia, unspecified: Secondary | ICD-10-CM | POA: Diagnosis not present

## 2017-02-06 DIAGNOSIS — E663 Overweight: Secondary | ICD-10-CM | POA: Diagnosis not present

## 2017-02-06 DIAGNOSIS — J449 Chronic obstructive pulmonary disease, unspecified: Secondary | ICD-10-CM | POA: Diagnosis not present

## 2017-02-06 DIAGNOSIS — I10 Essential (primary) hypertension: Secondary | ICD-10-CM | POA: Diagnosis not present

## 2017-02-06 DIAGNOSIS — J019 Acute sinusitis, unspecified: Secondary | ICD-10-CM | POA: Diagnosis not present

## 2017-02-06 DIAGNOSIS — K219 Gastro-esophageal reflux disease without esophagitis: Secondary | ICD-10-CM | POA: Diagnosis not present

## 2017-02-06 DIAGNOSIS — Z6826 Body mass index (BMI) 26.0-26.9, adult: Secondary | ICD-10-CM | POA: Diagnosis not present

## 2017-02-06 DIAGNOSIS — E039 Hypothyroidism, unspecified: Secondary | ICD-10-CM | POA: Diagnosis not present

## 2017-02-10 DIAGNOSIS — I480 Paroxysmal atrial fibrillation: Secondary | ICD-10-CM | POA: Diagnosis not present

## 2017-02-10 DIAGNOSIS — E785 Hyperlipidemia, unspecified: Secondary | ICD-10-CM | POA: Diagnosis not present

## 2017-02-10 DIAGNOSIS — R002 Palpitations: Secondary | ICD-10-CM | POA: Diagnosis not present

## 2017-02-13 DIAGNOSIS — I480 Paroxysmal atrial fibrillation: Secondary | ICD-10-CM | POA: Diagnosis not present

## 2017-02-13 DIAGNOSIS — E785 Hyperlipidemia, unspecified: Secondary | ICD-10-CM | POA: Diagnosis not present

## 2017-02-13 DIAGNOSIS — R0602 Shortness of breath: Secondary | ICD-10-CM | POA: Diagnosis not present

## 2017-03-04 ENCOUNTER — Ambulatory Visit: Payer: Medicare Other | Admitting: Pulmonary Disease

## 2017-03-18 ENCOUNTER — Encounter: Payer: Self-pay | Admitting: Pulmonary Disease

## 2017-03-18 ENCOUNTER — Ambulatory Visit (INDEPENDENT_AMBULATORY_CARE_PROVIDER_SITE_OTHER)
Admission: RE | Admit: 2017-03-18 | Discharge: 2017-03-18 | Disposition: A | Discharge status: Home / Self Care | Payer: Medicare Other | Source: Ambulatory Visit | Attending: Pulmonary Disease | Admitting: Pulmonary Disease

## 2017-03-18 ENCOUNTER — Ambulatory Visit (INDEPENDENT_AMBULATORY_CARE_PROVIDER_SITE_OTHER): Payer: Medicare Other | Admitting: Pulmonary Disease

## 2017-03-18 VITALS — BP 112/64 | HR 70 | Temp 97.4°F | Ht 70.0 in | Wt 177.5 lb

## 2017-03-18 DIAGNOSIS — I480 Paroxysmal atrial fibrillation: Secondary | ICD-10-CM | POA: Diagnosis not present

## 2017-03-18 DIAGNOSIS — R05 Cough: Secondary | ICD-10-CM | POA: Diagnosis not present

## 2017-03-18 DIAGNOSIS — K219 Gastro-esophageal reflux disease without esophagitis: Secondary | ICD-10-CM | POA: Diagnosis not present

## 2017-03-18 DIAGNOSIS — R0600 Dyspnea, unspecified: Secondary | ICD-10-CM

## 2017-03-18 DIAGNOSIS — F411 Generalized anxiety disorder: Secondary | ICD-10-CM | POA: Diagnosis not present

## 2017-03-18 DIAGNOSIS — R0602 Shortness of breath: Secondary | ICD-10-CM | POA: Diagnosis not present

## 2017-03-18 DIAGNOSIS — I4891 Unspecified atrial fibrillation: Secondary | ICD-10-CM | POA: Insufficient documentation

## 2017-03-18 DIAGNOSIS — R059 Cough, unspecified: Secondary | ICD-10-CM

## 2017-03-18 MED ORDER — TIOTROPIUM BROMIDE-OLODATEROL 2.5-2.5 MCG/ACT IN AERS: 1.0000 | INHALATION_SPRAY | Freq: Every day | RESPIRATORY_TRACT | 0 refills | Status: DC

## 2017-03-18 MED ORDER — LORAZEPAM 1 MG PO TABS: 0.5000 mg | ORAL_TABLET | Freq: Three times a day (TID) | ORAL | 5 refills | Status: DC | PRN

## 2017-03-18 NOTE — Patient Instructions (Signed)
Today we updated your med list in our EPIC system...    Continue your current medications the same...  Continue the STIOLTO deaily & the SINGULAIR at night...    Use the PROAIR-HFA as needed for wheezing/ chest tightness/ etc...  Today we did a follow up CXR...    We will contact you w/ the results when available...   Stay as active as possible & get in a good exercise routine...  Call for any questions...  Let's plan a follow up visit in 58mo, sooner if needed for problems.Marland KitchenMarland Kitchen

## 2017-03-18 NOTE — Progress Notes (Signed)
Subjective:     Patient ID: Bailey Ashley, female   DOB: 02-06-1946, 71 y.o.   MRN: [id number]  HPI 71 y/o WF referred for eval of dyspnea> mostly c/o DOE & she's very sedentary/deconditioned, also notes trouble getting a deep breath or getting enough air "IN"; Spirometry & oxygenation OK; she also has signif reflux x yrs and placed on a strict antireflux regimen  ~  April 04, 2015:  Initial consult by SN>   78 y/o woman referred by DrUppin of [facility] for evaluation of dyspnea; she is here w/ her daughter, Bailey Ashley she reports approx 13yr hx COPD/"asthma" w/ SOB/ DOE, some cough & wheezing, all worse over the last 19mo; she notes difficulty shopping, cleaning house, climbing stairs, "any activity"; says mild cough, no sput or hemoptysis, but notes "wheezing, chest tight";  She states that "I stay cold" she's on blood thinners and got herself a rolling walker "this helps"; on further questioning she notes that it seems hard to get the air "in", can't get a deep breath, not satisfied w/ her breathing & freq sighs; she admits to some anxiety- "I get worked up" she says, but she's been on Alprazolam 0.5mg  Tid & no better by her hx...     Smoking Hx> she started in her teens and quit in her 47s (over 49 yrs ago) but she did smoke up to 2ppd for <20 pack-yr smoking hx...    Reflux> she has hx signif reflux for yrs she says & takes Ranitadine for this...    Current meds>  Symbicort80-2spBid, ?Qvar80-2spBid, NEB w/ Duoneb Tid, Singulair10Qhs...    PMHx includes prev hx PE and cardiac hx (followed by [facility] in [place] but we don't have their records), ?CHF, she says they did ABG, CXR, ONO via [place] pending...    FamHx reveals that her husb died 5 yrs ago w/ severe COPD & pancreatic cancer, he was an Chief Financial Officer in the [employer] for 90yrs spent [place] in the middle [place], then [place] home (very stressful for pt)...  EXAM reveals Afeb, VSS, O2sat=100% on RA;  Heent- neg, mallampati2;   Chest- clear w/o w/r/r;  Cor- RR w/o m/r/g;  Ext- w/o c/c/e...  CXR 03/16/15 in [place] showed heart wnl in size, lungs clear, NAD...   ABG by [facility] 03/16/15 showed pH=7.41, pCO2=39, pO2=67 on RA  Spirometry 04/04/15 showed FVC=3.32 (90%), FEV1=2.46 (88%), %1sec=74, mid-flows were wnl at 97%predicted...  Ambulatory oxygen test> O2sat= 98% on RA at rest w/ pulse=126/min;  She walked 1 lap w/ lowest O2sat=97% w/ pulse=97% (stopped due to pain & "giving out"  LABS from [place] reviewed> IgE level=9 and all RAST tests NEG;  IMP/PLAN>> we discussed her dyspnea, esp the sensation of "can't get the air in" and lack of objective abnormalities; she is no better on her Alprazolam- therefore suggest switch to LORAZEPAM 1mg Tid;  I feel it would be helpful to establish w/ a good counselor as well, in the meantime try the new Zoloft called in by DrUppin;  She may continue to use her NEBs as needed, and rec starting an exercise program;  In addition we will start PANTOPRAZOLE for her reflux symptoms and an antireflux regimen... Plan ROV 65mo for recheck...  ~  May 04, 2015:  66mo ROV w/ SN>  Tria was seen 30mo ago w/ asthma, SOB w/ any activity, wheezing & tight in the chest, anxiety related to the passing of her husb several yrs ago; we adjusted her meds in the  interval to account for her insurance coverage to [employer] 1/d, Qvar80-2spBid, Lorazepam1mg Tid, ProtonixQd, and counseling (which she did not do); she reports much better on these meds- able to get deeper breath, feels more satisfied breathing, etc;  She is now ambulating w/ her 4pt cane & not the walker, doing some housework and in the yard... EXAM remains clear & she is in better spirits...  We discussed ROV in 29mo sooner if needed prn...  ~  September 12, 2015:  72mo ROV w/ SN>  Bailey Ashley returns feeling well & doing satis; she had a summer vacation in [place] visiting family- she did well x for some trouble w/ the pollen and one sudden syncopal spell, seen  by physician in [place] & nothing found, she denies similat prob before or since;  The pollen has recently caused some sneezing, cough w/ yellow mucus, & congestion- we discussed Rx w/ ZPak, Claritin/ Flonase, Mucinex, Delsym... We reviewed the following medical problems during today's office visit >>     Dyspnea- likely multifactorial w/ anxiety, chest wall factors, reflux playing a roll => improved on Rx & reminded about exercise...    Hx Asthma> on Stiolto- one puff daily & ProairHFA prn; doing satis w/o wheezing, tightness, etc...    Hx PE> on Xarelto per [facility] in [place], we do not have their records...    Hx CHF> followed by [facility] in [place], we do not have their records, Echo results, etc; pt not on any other cardiac meds....    Hyperlipid> on Simva40 per PCP in [place]    GERD> on Protonix40 & Ranitadine 300mg bid; we reviewed the need for antireflux regimen (elev HOB, NPO after dinner, etc)...    Hypothyroid> on Synthroid125 & followed by her PCP...    Anxiety/ Depression> on Lorazepam 1mg - 1/2 to 1 tab Tid & Zoloft100- 1/2 tab daily; husb passed away 44yrs ago & very stressful for pt... EXAM reveals Afeb, VSS, O2sat=99% on RA;  Heent- neg, mallampati2;  Chest- clear w/o w/r/r;  Cor- RR w/o m/r/g;  Ext- w/o c/c/e... IMP/PLAN>>  We wrote for a ZPak 7 some refills for prn use; she will continue w/ the Stiolto, Lorazepam, and Zoloft; we reviewed the need for a progressive exercise program; plan ROV in 58mo, sooner if needed prn...  ~  March 11, 2016:  69mo ROV & [name] indicates that she is doing satis just noting some recent throat clearing, sl cough w/o sput, no CP x w/ coughing & denies much SOB x w/ exertion;  She does her ADLs ok, does her shopping ok (sometimes takes the motorized cart), and ambulates w/ her cane;  She needs incr exercise program but she complains that insurance won't pay for silver sneakers- I told her she needs to cover the cost of exercise program on her own, it's THAT  important (states the Y is too expensive "it's $40 per month!")... She remains on Stiolto daily, Singulair10, Flonase, and AlbutHFA prn; she has the Ativan1mg  as well...     EXAM reveals Afeb, VSS, O2sat=98% on RA;  Heent- neg, mallampati2;  Chest- clear w/o w/r/r;  Cor- RR w/o m/r/g;  Abd- soft, nontender, neg;  Ext- w/o c/c/e;  Neuro- gait abn, otherw nonfocal...  CXR 03/11/16>  Norm heart size, tiny RUL nodule , biapical scarring, and mild atx in right mid lung zone; osteopenia in spine => I called pt & rec CT Chest for further eval...  IMP/PLAN>>  She appears stable but I believe could be better  if she increased her exercise program- clearly needs some supervision as in a silver sneaker program or joining the Y etc... CXR today shows some chr changes & ?tiny nodule in RUL area- suggest CT Chest for further eval... Rec to continue her Stiolto daily + the Singulair, Flonase, & the AlbutHFA rescue inhaler prn... She can use cough drops, throat lozenges, Delsym & Tramadol as needed for the cough;  We wrote for some Hycodan for prn use at night...  ADDENDUM>  CT Chest 03/14/16 showed some atherosclerotic dis in LAD, calcif/granulomatous left suprahilar node, mild reticulonodular opac in lung apicies w/ nodular component ~76mm (corresponds to CXR) & likely represents scar tissue, left renal cyst, & benign splenic lymphangioma; Radiology felt that f/u imaging not nec...   ~  September 04, 2016:  78mo ROV & pulmonary follow up visit> Shelonda notes a sl dry cough 7 wonders if one of her AFib meds might be making her cough (she is NOT on an ACE or ARB);  She continues on Stiolto-2sp/d, Singulair10, AlbutHFA rescue prn (hasn't needed), and Hycodan prn;  She also has Loazepam 1mg -1/2 to 1 tab tid prn, along w/ Zoloft & Trazadone... Her PCP is DrUppin in [place] her Cardiology team is there too- unfortunately we do not have any notes from these providers in epic...  She reports that was was going to a local gym but  stopped when she had the onset of AFib; now cards has released her to return to exercise program & she will start back in the [employer] program...    Dyspnea- likely multifactorial w/ anxiety, chest wall factors, reflux playing a roll => improved on Rx w/ Lorazepam, Stiolto, Singulair, Mucinex, etc...    Hx Asthma> on Stiolto- one puff daily & ProairHFA prn; doing satis w/o wheezing, tightness, etc...    Hx PE> on Xarelto per [facility] in [place], we do not have their records...    New onset AFib, Hx CHF> followed by [facility] in [place], we do not have their records, Echo results, etc; currently taking ?Flecainide, Xarelto, [id number] & asked to get notes for [place] to scan into Epic.    Hyperlipid> on Simva40 per PCP in [place]    GERD> on Protonix40 & Ranitadine 300mg bid; we reviewed the need for antireflux regimen (elev HOB, NPO after dinner, etc)...    Hypothyroid> on Synthroid125 & followed by her PCP...    Anxiety/ Depression> on Lorazepam 1mg - 1/2 to 1 tab Tid & Zoloft100- 1/2 tab daily; husb passed away 52yrs ago & very stressful for pt... EXAM reveals Afeb, VSS, O2sat=100% on RA;  Heent- neg, mallampati2;  Chest- clear w/o w/r/r;  Cor- RR w/o m/r/g;  Ext- w/o c/c/e... IMP/PLAN>>  We refilled Lorazepam, and rec continuing Stiolto, Singulair, AlbutHFA, Mucinex, fluids, and Delsym... Call for any problems.   ~  March 18, 2017:  13mo ROV & pulm follow up visit>  Dajsha reports having a URI 74mo ago, seen by her PCP & treated w/ ZPak=> improved but c/o some cough, discolored sput, but denies f/c/s, etc... Overall she says "pretty good"We reviewed the following medical problems during today's office visit >>     Dyspnea- likely multifactorial w/ asthma, anxiety, chest wall factors, reflux playing a roll => improved on Rx w/ Lorazepam, Stiolto, Singulair, Mucinex, etc...    Hx allergic rhinitis> on Singulair10, Flonase, and has been treated for sinusitis...    Hx Asthma> on Stiolto- one puff daily &  ProairHFA prn; doing satis w/o wheezing, tightness,  etc...    Hx PE> on Xarelto per [facility] in [place], we do not have their records...    AFib, Hx CHF> followed by [facility] in [place], we do not have their records, Echo results, etc; currently taking ?Flecainide, Xarelto, [id number] & asked to get notes for [place] to scan into Epic (none yet); she is holding NSR- pt reports "everything is fine" and CP "resolved on it's own"... She needs a regular exercise program...    Hyperlipid> on Simva40 per PCP- DrUppin in [place]    GERD> on Protonix40 & Ranitadine 300mg bid; we reviewed the need for antireflux regimen (elev HOB, NPO after dinner, etc)...    Hypothyroid> on Synthroid125 & followed by her PCP...    Anxiety/ Depression> on Lorazepam 1mg - 1/2 to 1 tab Tid & Zoloft100- 1/2 tab daily; husb passed away 62yrs ago & very stressful for pt... EXAM reveals Afeb, VSS, O2sat=98% on RA;  Heent- neg, mallampati2;  Chest- clear w/o w/r/r;  Cor- RR w/o m/r/g;  Ext- w/o c/c/e...  CXR 03/18/17 (independently reviewed by me in the PACS system) shows norm heart size, clear lungs, prev reported nodular changes RUL is less well apprec, no focal infiltrates- NAD... IMP/PLAN>>  Makiyah is stable, notes occas URIs, improved w/ ZPak;  CXR is clear- NAD;  Rec to continue Stiolto regularly- needs improved exercise program etc; she is asked to get records from [place] for [place] to scan into Epic...    Past Medical History  Diagnosis Date  . Asthma   . GERD (gastroesophageal reflux disease) >> on Protonix40 & Ranitadine 300 Bid   . Hyperlipidemia >> on Simva40 Qhs   . Hypothyroid >> on Levothy125   . Osteoporosis   . Depression >> on ZOLOFT100   . Psoriasis   . Ulnar neuropathy   . Constipation   . Hypertension >. Prev on Atenolol25- off now    . Bilateral hip pain >> on Tramadol50 prn   . Edema   . Tachycardia   . Congestive heart failure >> on ASA81; prev on Lasix40- 2daily & K20Bid- off now   . History of smoking    . Grief reaction >> on Ativan 1mg  tid prn   . Gout >> on Allopurinol100   . Pulmonary embolism    Past Surgical History:  Procedure Laterality Date  . Cataract surgery    . NASAL SINUS SURGERY      Outpatient Encounter Prescriptions as of 03/18/2017  Medication Sig  . albuterol (PROVENTIL HFA;VENTOLIN HFA) 108 (90 BASE) MCG/ACT inhaler Inhale 2 puffs into the lungs every 6 (six) hours as needed for wheezing or shortness of breath.  . allopurinol (ZYLOPRIM) 100 MG tablet   . aspirin 81 MG chewable tablet Chew 81 mg by mouth daily.  . baclofen (LIORESAL) 10 MG tablet Take 10 mg by mouth 2 (two) times daily as needed for muscle spasms.  . fluticasone (FLONASE) 50 MCG/ACT nasal spray   . HYDROcodone-homatropine (HYCODAN) 5-1.5 MG/5ML syrup Take 5 mLs by mouth every 6 (six) hours as needed for cough.  . levothyroxine (SYNTHROID, LEVOTHROID) 125 MCG tablet 125 mcg daily.  Marland Kitchen LORazepam (ATIVAN) 1 MG tablet Take 0.5-1 tablets (0.5-1 mg total) by mouth 3 (three) times daily as needed for anxiety.  . meclizine (ANTIVERT) 12.5 MG tablet   . montelukast (SINGULAIR) 10 MG tablet Take 10 mg by mouth.  . Multiple Vitamins-Minerals (MULTIVITAMIN WITH MINERALS) tablet Take 1 tablet by mouth daily.  . pantoprazole (PROTONIX) 40 MG tablet 40 mg daily.  Marland Kitchen  ranitidine (ZANTAC) 300 MG capsule Take 300 mg by mouth.  . rivaroxaban (XARELTO) 20 MG TABS tablet Take 20 mg by mouth daily with supper.  . sertraline (ZOLOFT) 100 MG tablet Take 50 mg by mouth daily.  . simvastatin (ZOCOR) 40 MG tablet Take 40 mg by mouth.  . Tiotropium Bromide-Olodaterol (STIOLTO RESPIMAT IN) Inhale 1 puff into the lungs daily.  . traMADol (ULTRAM) 50 MG tablet Take 50 mg by mouth at bedtime as needed.  . traZODone (DESYREL) 150 MG tablet 150 mg at bedtime.  . [DISCONTINUED] LORazepam (ATIVAN) 1 MG tablet Take 0.5-1 tablets (0.5-1 mg total) by mouth 3 (three) times daily as needed for anxiety.  . Tiotropium Bromide-Olodaterol  (STIOLTO RESPIMAT) 2.5-2.5 MCG/ACT AERS Inhale 1 puff into the lungs daily.   No facility-administered encounter medications on file as of 03/18/2017.     Allergies  Allergen Reactions  . Contrast Media [Iodinated Diagnostic Agents] Shortness Of Breath  . Sulfa Antibiotics Hives    Current Medications, Allergies, Past Medical History, Past Surgical History, Family History, and Social History were reviewed in [employer] record.   Review of Systems          All symptoms NEG except where BOLDED >>  Constitutional:  F/C/S, fatigue, anorexia, unexpected weight change. HEENT:  HA, visual changes, hearing loss, earache, nasal symptoms, sore throat, mouth sores, hoarseness. Resp:  cough, sputum, hemoptysis; SOB, tightness, wheezing. Cardio:  CP, palpit, DOE, orthopnea, edema. GI:  N/V/D/C, blood in stool; reflux, abd pain, distention, gas. GU:  dysuria, freq, urgency, hematuria, flank pain, voiding difficulty. MS:  joint pain, swelling, tenderness, decr ROM; neck pain, back pain, etc. Neuro:  HA, tremors, seizures, dizziness, syncope, weakness, numbness, gait abn. Skin:  suspicious lesions or skin rash. Heme:  adenopathy, bruising, bleeding. Psyche:  confusion, agitation, sleep disturbance, hallucinations, anxiety, depression suicidal.   Objective:   Physical Exam    Vital Signs:  Reviewed...   General:  WD, WN, 71 y/o WF in NAD; alert & oriented; pleasant & cooperative... HEENT:  [employer]/AT; Conjunctiva- pink, Sclera- nonicteric, EOM-wnl, PERRLA, EACs-clear, TMs-wnl; NOSE-clear; THROAT-clear & wnl.  Neck:  Supple w/ fair ROM; no JVD; normal carotid impulses w/o bruits; no thyromegaly or nodules palpated; no lymphadenopathy.  Chest:  Clear to P & A; without wheezes, rales, or rhonchi heard. Heart:  Regular Rhythm; norm S1 & S2 without murmurs, rubs, or gallops detected. Abdomen:  Soft & nontender- no guarding or rebound; normal bowel sounds; no organomegaly or masses  palpated. Ext:  Normal ROM; without deformities, +arthritic changes; no varicose veins,+ venous insuffic, tr edema;  Pulses intact w/o bruits. Neuro:  CNs II-XII intact; motor testing normal; sensory testing normal; gait normal & balance OK. Derm:  No lesions noted; no rash etc. Lymph:  No cervical, supraclavicular, axillary, or inguinal adenopathy palpated.   Assessment:      IMP >>    Dyspnea- likely multifactorial w/ anxiety, chest wall factors, reflux playing a roll...     Hx AR, sinusitis    Hx Asthma    ?tiny RUL nodule on CXR 02/2016=> CXR improved 02/2017...    Hx CHF    GERD    Anxiety/ Depression    Other medical issues as noted...  PLAN >>  05/04/15> She is much improved on Stiolto 1/d, Qvar80-2spBid, Protonix40, and Lorazepam1mg Tid... Rec to continue same for now and we will plan to slowly wean over time... 09/12/15> We wrote for a ZPak & some refills for prn use;  she will continue w/ the Stiolto, Lorazepam, and Zoloft; we reviewed the need for a progressive exercise program...  03/11/16> Appears stable on Stiolto, Singulair, Flonase, AlbutHFA but she is still not exercising (to help her DOE) & c/o sl cough & reflux; CXR here shows ?tiny RUL nodule & we will check CT; needs better exercise program and antireflux regimen... 09/04/16>   We refilled Lorazepam, and rec continuing Stiolto, Singulair, AlbutHFA, Mucinex, fluids, and Delsym... Call for any problems 03/18/17>   Elijah is stable, notes occas URIs, improved w/ ZPak;  CXR is clear- NAD;  Rec to continue Stiolto regularly- needs improved exercise program etc; she is asked to get records from [facility] for [place] to scan into Epic     Plan:     Patient's Medications  New Prescriptions   TIOTROPIUM BROMIDE-OLODATEROL (STIOLTO RESPIMAT) 2.5-2.5 MCG/ACT AERS    Inhale 1 puff into the lungs daily.  Previous Medications   ALBUTEROL (PROVENTIL HFA;VENTOLIN HFA) 108 (90 BASE) MCG/ACT INHALER    Inhale 2 puffs into the lungs every 6  (six) hours as needed for wheezing or shortness of breath.   ALLOPURINOL (ZYLOPRIM) 100 MG TABLET       ASPIRIN 81 MG CHEWABLE TABLET    Chew 81 mg by mouth daily.   BACLOFEN (LIORESAL) 10 MG TABLET    Take 10 mg by mouth 2 (two) times daily as needed for muscle spasms.   FLUTICASONE (FLONASE) 50 MCG/ACT NASAL SPRAY       HYDROCODONE-HOMATROPINE (HYCODAN) 5-1.5 MG/5ML SYRUP    Take 5 mLs by mouth every 6 (six) hours as needed for cough.   LEVOTHYROXINE (SYNTHROID, LEVOTHROID) 125 MCG TABLET    125 mcg daily.   MECLIZINE (ANTIVERT) 12.5 MG TABLET       MONTELUKAST (SINGULAIR) 10 MG TABLET    Take 10 mg by mouth.   MULTIPLE VITAMINS-MINERALS (MULTIVITAMIN WITH MINERALS) TABLET    Take 1 tablet by mouth daily.   PANTOPRAZOLE (PROTONIX) 40 MG TABLET    40 mg daily.   RANITIDINE (ZANTAC) 300 MG CAPSULE    Take 300 mg by mouth.   RIVAROXABAN (XARELTO) 20 MG TABS TABLET    Take 20 mg by mouth daily with supper.   SERTRALINE (ZOLOFT) 100 MG TABLET    Take 50 mg by mouth daily.   SIMVASTATIN (ZOCOR) 40 MG TABLET    Take 40 mg by mouth.   TIOTROPIUM BROMIDE-OLODATEROL (STIOLTO RESPIMAT IN)    Inhale 1 puff into the lungs daily.   TRAMADOL (ULTRAM) 50 MG TABLET    Take 50 mg by mouth at bedtime as needed.   TRAZODONE (DESYREL) 150 MG TABLET    150 mg at bedtime.  Modified Medications   Modified Medication Previous Medication   LORAZEPAM (ATIVAN) 1 MG TABLET LORazepam (ATIVAN) 1 MG tablet      Take 0.5-1 tablets (0.5-1 mg total) by mouth 3 (three) times daily as needed for anxiety.    Take 0.5-1 tablets (0.5-1 mg total) by mouth 3 (three) times daily as needed for anxiety.  Discontinued Medications   No medications on file

## 2017-03-26 ENCOUNTER — Telehealth: Payer: Self-pay | Admitting: Pulmonary Disease

## 2017-03-26 NOTE — Telephone Encounter (Addendum)
Received a notice from [facility] stating that the Lorazepam is no longer covered  Pt takes lorazepam 1 mg tid  Per SN- she was on alprazolam in the back and this did not work for her  She has been on the lorazepam and this really helps  SN did some research and states that she can get the [facility]am [facility] with a coupon for $11.77 (coupon printed) SN also rec that we call [facility] to see how much # 90 tabs would be there Spoke with Martinique at [place]- # 90 tabs would cost her 21.69   Called to discuss this with the pt and see what she wants to do  Her daughter states pt is currently in [place] due to a death in the family  She will have her call us next wk when she returns  I have placed coupon on Leigh's desk and will forward to her to f/u on  Note that pt was given a 30 day supply of lorazepam on 03/18/17  Thanks

## 2017-04-30 ENCOUNTER — Telehealth: Payer: Self-pay | Admitting: Pulmonary Disease

## 2017-04-30 NOTE — Telephone Encounter (Signed)
A PA for Lorazepam was initiated and approved via [email or web address] using key [id number]. Per covermymeds "SYVGCY:[id number];MZUAUE:[id number];Review Type:Prior Auth;Coverage Start Date:03/31/2017;Coverage End Date:04/30/2018." Pharmacy and patient made aware of approval. Nothing further is needed.   CMM Key: [id number]

## 2017-05-14 DIAGNOSIS — S8991XA Unspecified injury of right lower leg, initial encounter: Secondary | ICD-10-CM | POA: Diagnosis not present

## 2017-05-14 DIAGNOSIS — Z6826 Body mass index (BMI) 26.0-26.9, adult: Secondary | ICD-10-CM | POA: Diagnosis not present

## 2017-05-14 DIAGNOSIS — M25562 Pain in left knee: Secondary | ICD-10-CM | POA: Diagnosis not present

## 2017-05-14 DIAGNOSIS — J449 Chronic obstructive pulmonary disease, unspecified: Secondary | ICD-10-CM | POA: Diagnosis not present

## 2017-05-14 DIAGNOSIS — S8992XA Unspecified injury of left lower leg, initial encounter: Secondary | ICD-10-CM | POA: Diagnosis not present

## 2017-05-14 DIAGNOSIS — E039 Hypothyroidism, unspecified: Secondary | ICD-10-CM | POA: Diagnosis not present

## 2017-05-14 DIAGNOSIS — G8929 Other chronic pain: Secondary | ICD-10-CM | POA: Diagnosis not present

## 2017-05-14 DIAGNOSIS — M109 Gout, unspecified: Secondary | ICD-10-CM | POA: Diagnosis not present

## 2017-05-14 DIAGNOSIS — M797 Fibromyalgia: Secondary | ICD-10-CM | POA: Diagnosis not present

## 2017-05-14 DIAGNOSIS — M170 Bilateral primary osteoarthritis of knee: Secondary | ICD-10-CM | POA: Diagnosis not present

## 2017-05-14 DIAGNOSIS — K219 Gastro-esophageal reflux disease without esophagitis: Secondary | ICD-10-CM | POA: Diagnosis not present

## 2017-05-14 DIAGNOSIS — F418 Other specified anxiety disorders: Secondary | ICD-10-CM | POA: Diagnosis not present

## 2017-05-14 DIAGNOSIS — J309 Allergic rhinitis, unspecified: Secondary | ICD-10-CM | POA: Diagnosis not present

## 2017-05-14 DIAGNOSIS — M25561 Pain in right knee: Secondary | ICD-10-CM | POA: Diagnosis not present

## 2017-06-03 DIAGNOSIS — Z79899 Other long term (current) drug therapy: Secondary | ICD-10-CM | POA: Diagnosis not present

## 2017-06-03 DIAGNOSIS — K219 Gastro-esophageal reflux disease without esophagitis: Secondary | ICD-10-CM | POA: Diagnosis not present

## 2017-06-03 DIAGNOSIS — I951 Orthostatic hypotension: Secondary | ICD-10-CM | POA: Diagnosis not present

## 2017-06-03 DIAGNOSIS — E039 Hypothyroidism, unspecified: Secondary | ICD-10-CM | POA: Diagnosis not present

## 2017-06-03 DIAGNOSIS — E785 Hyperlipidemia, unspecified: Secondary | ICD-10-CM | POA: Diagnosis not present

## 2017-06-03 DIAGNOSIS — Z6826 Body mass index (BMI) 26.0-26.9, adult: Secondary | ICD-10-CM | POA: Diagnosis not present

## 2017-06-03 DIAGNOSIS — E663 Overweight: Secondary | ICD-10-CM | POA: Diagnosis not present

## 2017-06-03 DIAGNOSIS — E559 Vitamin D deficiency, unspecified: Secondary | ICD-10-CM | POA: Diagnosis not present

## 2017-06-03 DIAGNOSIS — F418 Other specified anxiety disorders: Secondary | ICD-10-CM | POA: Diagnosis not present

## 2017-06-10 DIAGNOSIS — E785 Hyperlipidemia, unspecified: Secondary | ICD-10-CM | POA: Diagnosis not present

## 2017-06-10 DIAGNOSIS — I951 Orthostatic hypotension: Secondary | ICD-10-CM | POA: Diagnosis not present

## 2017-06-10 DIAGNOSIS — Z6826 Body mass index (BMI) 26.0-26.9, adult: Secondary | ICD-10-CM | POA: Diagnosis not present

## 2017-06-10 DIAGNOSIS — E663 Overweight: Secondary | ICD-10-CM | POA: Diagnosis not present

## 2017-06-10 DIAGNOSIS — K219 Gastro-esophageal reflux disease without esophagitis: Secondary | ICD-10-CM | POA: Diagnosis not present

## 2017-06-10 DIAGNOSIS — F418 Other specified anxiety disorders: Secondary | ICD-10-CM | POA: Diagnosis not present

## 2017-06-10 DIAGNOSIS — M810 Age-related osteoporosis without current pathological fracture: Secondary | ICD-10-CM | POA: Diagnosis not present

## 2017-06-10 DIAGNOSIS — Z79899 Other long term (current) drug therapy: Secondary | ICD-10-CM | POA: Diagnosis not present

## 2017-06-10 DIAGNOSIS — E039 Hypothyroidism, unspecified: Secondary | ICD-10-CM | POA: Diagnosis not present

## 2017-06-10 DIAGNOSIS — I10 Essential (primary) hypertension: Secondary | ICD-10-CM | POA: Diagnosis not present

## 2017-06-10 DIAGNOSIS — L989 Disorder of the skin and subcutaneous tissue, unspecified: Secondary | ICD-10-CM | POA: Diagnosis not present

## 2017-06-10 DIAGNOSIS — J449 Chronic obstructive pulmonary disease, unspecified: Secondary | ICD-10-CM | POA: Diagnosis not present

## 2017-06-11 DIAGNOSIS — N959 Unspecified menopausal and perimenopausal disorder: Secondary | ICD-10-CM | POA: Diagnosis not present

## 2017-06-11 DIAGNOSIS — Z9181 History of falling: Secondary | ICD-10-CM | POA: Diagnosis not present

## 2017-06-11 DIAGNOSIS — Z0000 Encounter for general adult medical examination without abnormal findings: Secondary | ICD-10-CM | POA: Diagnosis not present

## 2017-06-11 DIAGNOSIS — Z1389 Encounter for screening for other disorder: Secondary | ICD-10-CM | POA: Diagnosis not present

## 2017-06-11 DIAGNOSIS — E785 Hyperlipidemia, unspecified: Secondary | ICD-10-CM | POA: Diagnosis not present

## 2017-06-11 DIAGNOSIS — Z1231 Encounter for screening mammogram for malignant neoplasm of breast: Secondary | ICD-10-CM | POA: Diagnosis not present

## 2017-06-18 DIAGNOSIS — L578 Other skin changes due to chronic exposure to nonionizing radiation: Secondary | ICD-10-CM | POA: Diagnosis not present

## 2017-06-18 DIAGNOSIS — L820 Inflamed seborrheic keratosis: Secondary | ICD-10-CM | POA: Diagnosis not present

## 2017-06-18 DIAGNOSIS — L570 Actinic keratosis: Secondary | ICD-10-CM | POA: Diagnosis not present

## 2017-08-14 DIAGNOSIS — I10 Essential (primary) hypertension: Secondary | ICD-10-CM | POA: Diagnosis not present

## 2017-08-14 DIAGNOSIS — Z86711 Personal history of pulmonary embolism: Secondary | ICD-10-CM | POA: Diagnosis not present

## 2017-08-14 DIAGNOSIS — I959 Hypotension, unspecified: Secondary | ICD-10-CM | POA: Diagnosis not present

## 2017-08-14 DIAGNOSIS — Z8673 Personal history of transient ischemic attack (TIA), and cerebral infarction without residual deficits: Secondary | ICD-10-CM | POA: Diagnosis not present

## 2017-08-14 DIAGNOSIS — I480 Paroxysmal atrial fibrillation: Secondary | ICD-10-CM | POA: Diagnosis not present

## 2017-08-14 DIAGNOSIS — R42 Dizziness and giddiness: Secondary | ICD-10-CM | POA: Diagnosis not present

## 2017-08-14 DIAGNOSIS — Z7901 Long term (current) use of anticoagulants: Secondary | ICD-10-CM | POA: Diagnosis not present

## 2017-08-19 ENCOUNTER — Telehealth: Payer: Self-pay | Admitting: Pulmonary Disease

## 2017-08-20 DIAGNOSIS — E559 Vitamin D deficiency, unspecified: Secondary | ICD-10-CM | POA: Diagnosis not present

## 2017-08-20 DIAGNOSIS — Z79899 Other long term (current) drug therapy: Secondary | ICD-10-CM | POA: Diagnosis not present

## 2017-08-20 DIAGNOSIS — I4891 Unspecified atrial fibrillation: Secondary | ICD-10-CM | POA: Diagnosis not present

## 2017-08-20 DIAGNOSIS — E785 Hyperlipidemia, unspecified: Secondary | ICD-10-CM | POA: Diagnosis not present

## 2017-08-20 DIAGNOSIS — M810 Age-related osteoporosis without current pathological fracture: Secondary | ICD-10-CM | POA: Diagnosis not present

## 2017-08-20 DIAGNOSIS — Z6826 Body mass index (BMI) 26.0-26.9, adult: Secondary | ICD-10-CM | POA: Diagnosis not present

## 2017-08-20 DIAGNOSIS — K219 Gastro-esophageal reflux disease without esophagitis: Secondary | ICD-10-CM | POA: Diagnosis not present

## 2017-08-20 DIAGNOSIS — M1990 Unspecified osteoarthritis, unspecified site: Secondary | ICD-10-CM | POA: Diagnosis not present

## 2017-08-20 NOTE — Telephone Encounter (Signed)
I have spoken with [facility] with [facility]. Deb wanted clarification on issue date & number of refills for Lorazepam. I asked that [facility] d/c current Rx on file, as Dr. Lenna Gilford currently has a Rx on his desk to sign. Deb is aware that Rx will faxed once signed by SN.  Nothing further needed at this time.

## 2017-08-26 DIAGNOSIS — I480 Paroxysmal atrial fibrillation: Secondary | ICD-10-CM | POA: Diagnosis not present

## 2017-09-04 DIAGNOSIS — H52223 Regular astigmatism, bilateral: Secondary | ICD-10-CM | POA: Diagnosis not present

## 2017-09-04 DIAGNOSIS — H5203 Hypermetropia, bilateral: Secondary | ICD-10-CM | POA: Diagnosis not present

## 2017-09-04 DIAGNOSIS — H524 Presbyopia: Secondary | ICD-10-CM | POA: Diagnosis not present

## 2017-09-04 DIAGNOSIS — Z961 Presence of intraocular lens: Secondary | ICD-10-CM | POA: Diagnosis not present

## 2017-09-04 DIAGNOSIS — H25812 Combined forms of age-related cataract, left eye: Secondary | ICD-10-CM | POA: Diagnosis not present

## 2017-09-04 DIAGNOSIS — H35371 Puckering of macula, right eye: Secondary | ICD-10-CM | POA: Diagnosis not present

## 2017-09-04 DIAGNOSIS — Z9841 Cataract extraction status, right eye: Secondary | ICD-10-CM | POA: Diagnosis not present

## 2017-09-18 ENCOUNTER — Encounter: Payer: Self-pay | Admitting: Pulmonary Disease

## 2017-09-18 ENCOUNTER — Ambulatory Visit (INDEPENDENT_AMBULATORY_CARE_PROVIDER_SITE_OTHER): Payer: Medicare Other | Admitting: Pulmonary Disease

## 2017-09-18 ENCOUNTER — Ambulatory Visit (INDEPENDENT_AMBULATORY_CARE_PROVIDER_SITE_OTHER): Payer: Medicare Other

## 2017-09-18 VITALS — BP 122/64 | HR 86 | Temp 97.3°F | Ht 70.0 in | Wt 174.4 lb

## 2017-09-18 DIAGNOSIS — K219 Gastro-esophageal reflux disease without esophagitis: Secondary | ICD-10-CM | POA: Diagnosis not present

## 2017-09-18 DIAGNOSIS — R05 Cough: Secondary | ICD-10-CM | POA: Diagnosis not present

## 2017-09-18 DIAGNOSIS — Z23 Encounter for immunization: Secondary | ICD-10-CM

## 2017-09-18 DIAGNOSIS — R0600 Dyspnea, unspecified: Secondary | ICD-10-CM | POA: Diagnosis not present

## 2017-09-18 DIAGNOSIS — F411 Generalized anxiety disorder: Secondary | ICD-10-CM | POA: Diagnosis not present

## 2017-09-18 DIAGNOSIS — Z8709 Personal history of other diseases of the respiratory system: Secondary | ICD-10-CM | POA: Diagnosis not present

## 2017-09-18 DIAGNOSIS — I480 Paroxysmal atrial fibrillation: Secondary | ICD-10-CM | POA: Diagnosis not present

## 2017-09-18 DIAGNOSIS — R059 Cough, unspecified: Secondary | ICD-10-CM

## 2017-09-18 MED ORDER — LORAZEPAM 1 MG PO TABS: 0.5000 mg | ORAL_TABLET | Freq: Three times a day (TID) | ORAL | 5 refills | Status: AC | PRN

## 2017-09-18 MED ORDER — AZITHROMYCIN 250 MG PO TABS: ORAL_TABLET | ORAL | 2 refills | Status: DC

## 2017-09-18 MED ORDER — TIOTROPIUM BROMIDE-OLODATEROL 2.5-2.5 MCG/ACT IN AERS: 1.0000 | INHALATION_SPRAY | Freq: Every day | RESPIRATORY_TRACT | 0 refills | Status: DC

## 2017-09-18 MED ORDER — TIOTROPIUM BROMIDE-OLODATEROL 2.5-2.5 MCG/ACT IN AERS: 1.0000 | INHALATION_SPRAY | Freq: Every day | RESPIRATORY_TRACT | 3 refills | Status: AC

## 2017-09-18 NOTE — Progress Notes (Signed)
Subjective:     Patient ID: Bailey Ashley, female   DOB: 02-06-1946, 71 y.o.   MRN: [id number]  HPI 71 y/o WF referred for eval of dyspnea> mostly c/o DOE & she's very sedentary/deconditioned, also notes trouble getting a deep breath or getting enough air "IN"; Spirometry & oxygenation OK; she also has signif reflux x yrs and placed on a strict antireflux regimen  ~  April 04, 2015:  Initial consult by Bailey Ashley>   78 y/o woman referred by Bailey Ashley of [facility] for evaluation of dyspnea; she is here w/ her daughter, Bailey Ashley she reports approx 13yr hx COPD/"asthma" w/ SOB/ DOE, some cough & wheezing, all worse over the last 19mo; she notes difficulty shopping, cleaning house, climbing stairs, "any activity"; says mild cough, no sput or hemoptysis, but notes "wheezing, chest tight";  She states that "I stay cold" she's on blood thinners and got herself a rolling walker "this helps"; on further questioning she notes that it seems hard to get the air "in", can't get a deep breath, not satisfied w/ her breathing & freq sighs; she admits to some anxiety- "I get worked up" she says, but she's been on Bailey Ashley 0.5mg  Ashley & no better by her hx...     Smoking Hx> she started in her teens and quit in her 47s (over 49 yrs ago) but she did smoke up to 2ppd for <20 pack-yr smoking hx...    Reflux> she has hx signif reflux for yrs she says & takes Bailey Ashley for this...    Current meds>  Symbicort80-2spBid, ?Qvar80-2spBid, Bailey Ashley w/ Bailey Ashley Ashley, Bailey Ashley...    PMHx includes prev hx PE and cardiac hx (followed by Bailey Ashley in [place] but we don't have their records), ?CHF, she says they did ABG, CXR, ONO via Bailey Ashley pending...    FamHx reveals that her husb died 5 yrs ago w/ severe COPD & pancreatic cancer, he was an Chief Financial Officer in the [employer] for 90yrs spent [place] in the [place], then [facility] home (very stressful for pt)...  EXAM reveals Afeb, VSS, O2sat=100% on RA;  Heent- neg, mallampati2;   Chest- clear w/o w/r/r;  Cor- RR w/o m/r/g;  Ext- w/o c/c/e...  CXR 03/16/15 in [place] showed heart wnl in size, lungs clear, NAD...   ABG by Ashley 03/16/15 showed pH=7.41, pCO2=39, pO2=67 on RA  Spirometry 04/04/15 showed FVC=3.32 (90%), FEV1=2.46 (88%), %1sec=74, mid-flows were wnl at 97%predicted...  Ambulatory oxygen test> O2sat= 98% on RA at rest w/ pulse=126/min;  She walked 1 lap w/ lowest O2sat=97% w/ pulse=97% (stopped due to pain & "giving out"  LABS from [place] reviewed> IgE level=9 and all RAST tests NEG;  IMP/PLAN>> we discussed her dyspnea, esp the sensation of "can't get the air in" and lack of objective abnormalities; she is no better on her Bailey Ashley- therefore suggest switch to Bailey Ashley;  I feel it would be helpful to establish w/ a good counselor as well, in the meantime try the new Bailey Ashley;  She may continue to use her NEBs as needed, and rec starting an exercise program;  In addition we will start Bailey Ashley for her reflux symptoms and an antireflux regimen... Plan ROV 65mo for recheck...  ~  May 04, 2015:  66mo ROV w/ Bailey Ashley>  Bailey Ashley was seen 30mo ago w/ asthma, SOB w/ any activity, wheezing & tight in the chest, anxiety related to the passing of her husb several yrs ago; we adjusted her meds in the  interval to account for her insurance coverage to Bailey Ashley 1/d, Qvar80-2spBid, Lorazepam1mg Ashley, ProtonixQd, and counseling (which she did not do); she reports much better on these meds- able to get deeper breath, feels more satisfied breathing, etc;  She is now ambulating w/ her 4pt cane & not the walker, doing some housework and in the yard... EXAM remains clear & she is in better spirits...  We discussed ROV in 29mo sooner if needed prn...  ~  September 12, 2015:  72mo ROV w/ Bailey Ashley>  Bailey Ashley returns feeling well & doing satis; she had a summer vacation in [place] visiting family- she did well x for some trouble w/ the pollen and one sudden syncopal spell, seen  by physician in [place] & nothing found, she denies similat prob before or since;  The pollen has recently caused some sneezing, cough w/ yellow mucus, & congestion- we discussed Rx w/ Bailey Ashley, Bailey Ashley/ Bailey Ashley, Bailey Ashley, Bailey Ashley... We reviewed the following medical problems during today's office visit >>     Dyspnea- likely multifactorial w/ anxiety, chest wall factors, reflux playing a roll => improved on Rx & reminded about exercise...    Hx Asthma> on Bailey Ashley- one puff daily & Bailey Ashley prn; doing satis w/o wheezing, tightness, etc...    Hx PE> on Bailey [facility] per Ashley in [place], we do not have their records...    Hx CHF> followed by Bailey [facility] in [place], we do not have their records, Echo results, etc; pt not on any other cardiac meds....    Hyperlipid> on Simva40 per PCP in [place]    GERD> on Protonix40 & Bailey Ashley 300mg bid; we reviewed the need for antireflux regimen (elev HOB, NPO after dinner, etc)...    Hypothyroid> on Synthroid125 & followed by her PCP...    Anxiety/ Depression> on Bailey Ashley 1mg - 1/2 to 1 tab Ashley & Zoloft100- 1/2 tab daily; husb passed away 44yrs ago & very stressful for pt... EXAM reveals Afeb, VSS, O2sat=99% on RA;  Heent- neg, mallampati2;  Chest- clear w/o w/r/r;  Cor- RR w/o m/r/g;  Ext- w/o c/c/e... IMP/PLAN>>  We wrote for a Bailey Ashley 7 some refills for prn use; she will continue w/ the Bailey Ashley, Bailey Ashley, and Bailey Ashley; we reviewed the need for a progressive exercise program; plan ROV in 58mo, sooner if needed prn...  ~  March 11, 2016:  69mo ROV & Bailey Ashley indicates that she is doing satis just noting some recent throat clearing, sl cough w/o sput, no CP x w/ coughing & denies much SOB x w/ exertion;  She does her ADLs ok, does her shopping ok (sometimes takes the motorized cart), and ambulates w/ her cane;  She needs incr exercise program but she complains that insurance won't pay for silver sneakers- I told her she needs to cover the cost of exercise program on her own, it's THAT  important (states the Y is too expensive "it's $40 per month!")... She remains on Bailey Ashley daily, Singulair10, Bailey Ashley, and AlbutHFA prn; she has the Ativan1mg  as well...     EXAM reveals Afeb, VSS, O2sat=98% on RA;  Heent- neg, mallampati2;  Chest- clear w/o w/r/r;  Cor- RR w/o m/r/g;  Abd- soft, nontender, neg;  Ext- w/o c/c/e;  Neuro- gait abn, otherw nonfocal...  CXR 03/11/16>  Norm heart size, tiny RUL nodule , biapical scarring, and mild atx in right mid lung zone; osteopenia in spine => I called pt & rec CT Chest for further eval...  IMP/PLAN>>  She appears stable but I believe could be better  if she increased her exercise program- clearly needs some supervision as in a silver sneaker program or joining the Y etc... CXR today shows some chr changes & ?tiny nodule in RUL area- suggest CT Chest for further eval... Rec to continue her Bailey Ashley daily + the Bailey Ashley, Bailey Ashley, & the AlbutHFA rescue inhaler prn... She can use cough drops, throat lozenges, Bailey Ashley & Tramadol as needed for the cough;  We wrote for some Hycodan for prn use at night...  ADDENDUM>  CT Chest 03/14/16 showed some atherosclerotic dis in LAD, calcif/granulomatous left suprahilar node, mild reticulonodular opac in lung apicies w/ nodular component ~14mm (corresponds to CXR) & likely represents scar tissue, left renal cyst, & benign splenic lymphangioma; Radiology felt that f/u imaging not nec...   ~  September 04, 2016:  64mo ROV & pulmonary follow up visit> Bailey Ashley notes a sl dry cough 7 wonders if one of her AFib meds might be making her cough (she is NOT on an ACE or ARB);  She continues on Bailey Ashley-2sp/d, Singulair10, AlbutHFA rescue prn (hasn't needed), and Hycodan prn;  She also has Loazepam 1mg -1/2 to 1 tab Ashley prn, along w/ Bailey Ashley & Trazadone... Her PCP is Bailey [facility] in [place] her Ashley team is there too- unfortunately we do not have any notes from these providers in epic...  She reports that was was going to a local gym but  stopped when she had the onset of AFib; now Ashley has released her to return to exercise program & she will start back in the [employer] program...    Dyspnea- likely multifactorial w/ anxiety, chest wall factors, reflux playing a roll => improved on Rx w/ Bailey Ashley, Bailey Ashley, Bailey Ashley, Bailey Ashley, etc...    Hx Asthma> on Bailey Ashley- one puff daily & Bailey Ashley prn; doing satis w/o wheezing, tightness, etc...    Hx PE> on Bailey [facility] per Ashley in [place], we do not have their records...    New onset AFib, Hx CHF> followed by [facility] in [place], we do not have their records, Echo results, etc; currently taking ?Flecainide, Bailey Ashley, [id number] & asked to get notes for [place] to scan into Epic.    Hyperlipid> on Simva40 per PCP in     GERD> on Protonix40 & Bailey [facility] 300mg bid; we reviewed the need for antireflux regimen (elev HOB, NPO after dinner, etc)...    Hypothyroid> on Synthroid125 & followed by her PCP...    Anxiety/ Depression> on Bailey Ashley 1mg - 1/2 to 1 tab Ashley & Zoloft100- 1/2 tab daily; husb passed away 38yrs ago & very stressful for pt... EXAM reveals Afeb, VSS, O2sat=100% on RA;  Heent- neg, mallampati2;  Chest- clear w/o w/r/r;  Cor- RR w/o m/r/g;  Ext- w/o c/c/e... IMP/PLAN>>  We refilled Bailey Ashley, and rec continuing Bailey Ashley, Bailey Ashley, AlbutHFA, Bailey Ashley, fluids, and Bailey Ashley... Call for any problems.  ~  March 18, 2017:  68mo ROV & pulm follow up visit>  Bailey Ashley reports having a URI 62mo ago, seen by her PCP & treated w/ Bailey Ashley=> improved but c/o some cough, discolored sput, but denies f/c/s, etc... Overall she says "pretty good"We reviewed the following medical problems during today's office visit >>     Dyspnea- likely multifactorial w/ asthma, anxiety, chest wall factors, reflux playing a roll => improved on Rx w/ Bailey Ashley, Bailey Ashley, Bailey Ashley, Bailey Ashley, etc...    Hx allergic rhinitis> on Singulair10, Bailey Ashley, and has been treated for sinusitis...    Hx Asthma> on Bailey Ashley- one puff daily &  Bailey Ashley prn; doing satis w/o wheezing, tightness, etc..Marland Kitchen  Hx PE> on Bailey Ashley per Ashley in [place], we do not have their records...    AFib, Hx CHF> followed by [facility] in [place], we do not have their records, Echo results, etc; currently taking ?Flecainide, Bailey Ashley, [id number] & asked to get notes for [place] to scan into Epic (none yet); she is holding NSR- pt reports "everything is fine" and CP "resolved on it's own"... She needs a regular exercise program...    Hyperlipid> on Simva40 per PCP- Bailey [facility] in [place]    GERD> on Protonix40 & Bailey [facility] 300mg bid; we reviewed the need for antireflux regimen (elev HOB, NPO after dinner, etc)...    Hypothyroid> on Synthroid125 & followed by her PCP...    Anxiety/ Depression> on Bailey Ashley 1mg - 1/2 to 1 tab Ashley & Zoloft100- 1/2 tab daily; husb passed away 39yrs ago & very stressful for pt... EXAM reveals Afeb, VSS, O2sat=98% on RA;  Heent- neg, mallampati2;  Chest- clear w/o w/r/r;  Cor- RR w/o m/r/g;  Ext- w/o c/c/e...  CXR 03/18/17 (independently reviewed by me in the PACS system) shows norm heart size, clear lungs, prev reported nodular changes RUL is less well apprec, no focal infiltrates- NAD... IMP/PLAN>>  Bailey Ashley is stable, notes occas URIs, improved w/ Bailey Ashley;  CXR is clear- NAD;  Rec to continue Bailey Ashley regularly- needs improved exercise program etc; she is asked to get records from [place] for [place] to scan into Epic...   ~  September 18, 2017:  67mo ROV & Bailey Ashley notes mild cough, green sput production for several days and incr SOB/DOE which is actually pretty stable over the last yr or more; she denies f/c/s, wt stable, and otherw about the same...  She has multifactorial dyspnea- improved & stable on prn Bailey Ashley & reminded that she can take it more regularly if needed;  Her asthma is controlled on Bailey Ashley one inhalation daily ([facility]);  She saw [facility] in [place] 08/14/17> f/u HBP & AFib on Flecainide100Bid, and Xarelto20; they  placed an event monitor & so far it has demonstated bouts of AFib w/ rvr=> added CartiaXT120...    EXAM reveals Afeb, VSS, O2sat=99% on RA;  Heent- neg, mallampati2;  Chest- clear w/o w/r/r;  Cor- RR w/o m/r/g;  Ext- w/o c/c/e;  Neuro- intact w/o focal deficits... IMP/PLAN>>  Bailey Ashley has a mild bronchitic exac & she has reponded well to Zithromax in the past=> REC Bailey Ashley and continue Bailey Ashley daily (NOTE- she could alsways incr to 2 inhalations/d if needed); use OTC Bailey Ashley and AlbutHFA rescue inhaler as needed; OK 2018 flu vaccine today...    Past Medical History  Diagnosis Date  . Asthma   . GERD (gastroesophageal reflux disease) >> on Protonix40 & Bailey Ashley 300 Bid   . Hyperlipidemia >> on Simva40 Qhs   . Hypothyroid >> on Levothy125   . Osteoporosis   . Depression >> on ZOLOFT100   . Psoriasis   . Ulnar neuropathy   . Constipation   . Hypertension >. Prev on Atenolol25- off now    . Bilateral hip pain >> on Tramadol50 prn   . Edema   . Tachycardia   . Congestive heart failure >> on ASA81; prev on Lasix40- 2daily & K20Bid- off now   . History of smoking   . Grief reaction >> on Ativan 1mg  Ashley prn   . Gout >> on Allopurinol100   . Pulmonary embolism    Past Surgical History:  Procedure Laterality Date  . Cataract surgery    . NASAL  SINUS SURGERY      Outpatient Encounter Prescriptions as of 09/18/2017  Medication Sig  . albuterol (PROVENTIL HFA;VENTOLIN HFA) 108 (90 BASE) MCG/ACT inhaler Inhale 2 puffs into the lungs every 6 (six) hours as needed for wheezing or shortness of breath.  . allopurinol (ZYLOPRIM) 100 MG tablet   . aspirin 81 MG chewable tablet Chew 81 mg by mouth daily.  . baclofen (LIORESAL) 10 MG tablet Take 10 mg by mouth 2 (two) times daily as needed for muscle spasms.  Marland Kitchen diltiazem (CARDIZEM CD) 120 MG 24 hr capsule Take 120 mg by mouth daily.  . flecainide (TAMBOCOR) 100 MG tablet Take 100 mg by mouth 2 (two) times daily.  . fluticasone (Bailey Ashley) 50 MCG/ACT  nasal spray   . HYDROcodone-homatropine (HYCODAN) 5-1.5 MG/5ML syrup Take 5 mLs by mouth every 6 (six) hours as needed for cough.  . levothyroxine (Bailey Ashley, LEVOTHROID) 125 MCG tablet 125 mcg daily.  Marland Kitchen Bailey Ashley (ATIVAN) 1 MG tablet Take 0.5-1 tablets (0.5-1 mg total) by mouth 3 (three) times daily as needed for anxiety.  . meclizine (ANTIVERT) 12.5 MG tablet   . montelukast (Bailey Ashley) 10 MG tablet Take 10 mg by mouth.  . Multiple Vitamins-Minerals (MULTIVITAMIN WITH MINERALS) tablet Take 1 tablet by mouth daily.  . Bailey Ashley (Bailey Ashley) 40 MG tablet 40 mg daily.  . ranitidine (ZANTAC) 300 MG capsule Take 300 mg by mouth.  . rivaroxaban (Bailey Ashley) 20 MG TABS tablet Take 20 mg by mouth daily with supper.  . sertraline (Bailey Ashley) 100 MG tablet Take 50 mg by mouth daily.  . simvastatin (ZOCOR) 40 MG tablet Take 40 mg by mouth.  . Tiotropium Bromide-Olodaterol (Bailey Ashley RESPIMAT) 2.5-2.5 MCG/ACT AERS Inhale 1 puff into the lungs daily.  . traMADol (ULTRAM) 50 MG tablet Take 50 mg by mouth at bedtime as needed.  . traZODone (DESYREL) 150 MG tablet 150 mg at bedtime.    Allergies  Allergen Reactions  . Contrast Media [Iodinated Diagnostic Agents] Shortness Of Breath  . Sulfa Antibiotics Hives    Current Medications, Allergies, Past Medical History, Past Surgical History, Family History, and Social History were reviewed in [employer] record.   Review of Systems          All symptoms NEG except where BOLDED >>  Constitutional:  F/C/S, fatigue, anorexia, unexpected weight change. HEENT:  HA, visual changes, hearing loss, earache, nasal symptoms, sore throat, mouth sores, hoarseness. Resp:  cough, sputum, hemoptysis; SOB, tightness, wheezing. Cardio:  CP, palpit, DOE, orthopnea, edema. GI:  N/V/D/C, blood in stool; reflux, abd pain, distention, gas. GU:  dysuria, freq, urgency, hematuria, flank pain, voiding difficulty. MS:  joint pain, swelling, tenderness, decr  ROM; neck pain, back pain, etc. Neuro:  HA, tremors, seizures, dizziness, syncope, weakness, numbness, gait abn. Skin:  suspicious lesions or skin rash. Heme:  adenopathy, bruising, bleeding. Psyche:  confusion, agitation, sleep disturbance, hallucinations, anxiety, depression suicidal.   Objective:   Physical Exam    Vital Signs:  Reviewed...   General:  WD, WN, 71 y/o WF in NAD; alert & oriented; pleasant & cooperative... HEENT:  /AT; Conjunctiva- pink, Sclera- nonicteric, EOM-wnl, PERRLA, EACs-clear, TMs-wnl; NOSE-clear; THROAT-clear & wnl.  Neck:  Supple w/ fair ROM; no JVD; normal carotid impulses w/o bruits; no thyromegaly or nodules palpated; no lymphadenopathy.  Chest:  Clear to P & A; without wheezes, rales, or rhonchi heard. Heart:  Regular Rhythm; norm S1 & S2 without murmurs, rubs, or gallops detected. Abdomen:  Soft & nontender- no guarding  or rebound; normal bowel sounds; no organomegaly or masses palpated. Ext:  Normal ROM; without deformities, +arthritic changes; no varicose veins,+ venous insuffic, tr edema;  Pulses intact w/o bruits. Neuro:  CNs II-XII intact; motor testing normal; sensory testing normal; gait normal & balance OK. Derm:  No lesions noted; no rash etc. Lymph:  No cervical, supraclavicular, axillary, or inguinal adenopathy palpated.   Assessment:      IMP >>    Dyspnea- likely multifactorial w/ anxiety, chest wall factors, reflux playing a roll...     Hx AR, sinusitis    Hx Asthma    ?tiny RUL nodule on CXR 02/2016=> CXR improved 02/2017...    Hx CHF    GERD    Anxiety/ Depression    Other medical issues as noted...  PLAN >>  05/04/15> She is much improved on Bailey Ashley 1/d, Qvar80-2spBid, Protonix40, and Lorazepam1mg Ashley... Rec to continue same for now and we will plan to slowly wean over time... 09/12/15> We wrote for a Bailey Ashley & some refills for prn use; she will continue w/ the Bailey Ashley, Bailey Ashley, and Bailey Ashley; we reviewed the need for a progressive  exercise program...  03/11/16> Appears stable on Bailey Ashley, Bailey Ashley, Bailey Ashley, AlbutHFA but she is still not exercising (to help her DOE) & c/o sl cough & reflux; CXR here shows ?tiny RUL nodule & we will check CT; needs better exercise program and antireflux regimen... 09/04/16>   We refilled Bailey Ashley, and rec continuing Bailey Ashley, Bailey Ashley, AlbutHFA, Bailey Ashley, fluids, and Bailey Ashley... Call for any problems 03/18/17>   Bailey Ashley is stable, notes occas URIs, improved w/ Bailey Ashley;  CXR is clear- NAD;  Rec to continue Bailey Ashley regularly- needs improved exercise program etc; she is asked to get records from [facility] for [place] to scan into Epic     Plan:     Patient's Medications  New Prescriptions   AZITHROMYCIN (ZITHROMAX) 250 MG TABLET    Take 2 today then 1 daily until gone  Previous Medications   ALBUTEROL (PROVENTIL HFA;VENTOLIN HFA) 108 (90 BASE) MCG/ACT INHALER    Inhale 2 puffs into the lungs every 6 (six) hours as needed for wheezing or shortness of breath.   ALLOPURINOL (ZYLOPRIM) 100 MG TABLET       ASPIRIN 81 MG CHEWABLE TABLET    Chew 81 mg by mouth daily.   BACLOFEN (LIORESAL) 10 MG TABLET    Take 10 mg by mouth 2 (two) times daily as needed for muscle spasms.   DILTIAZEM (CARDIZEM CD) 120 MG 24 HR CAPSULE    Take 120 mg by mouth daily.   FLECAINIDE (TAMBOCOR) 100 MG TABLET    Take 100 mg by mouth 2 (two) times daily.   FLUTICASONE (Bailey Ashley) 50 MCG/ACT NASAL SPRAY       HYDROCODONE-HOMATROPINE (HYCODAN) 5-1.5 MG/5ML SYRUP    Take 5 mLs by mouth every 6 (six) hours as needed for cough.   LEVOTHYROXINE (Bailey Ashley, LEVOTHROID) 125 MCG TABLET    125 mcg daily.   Bailey Ashley (ATIVAN) 1 MG TABLET    Take 0.5-1 tablets (0.5-1 mg total) by mouth 3 (three) times daily as needed for anxiety.   MECLIZINE (ANTIVERT) 12.5 MG TABLET       MONTELUKAST (Bailey Ashley) 10 MG TABLET    Take 10 mg by mouth.   MULTIPLE VITAMINS-MINERALS (MULTIVITAMIN WITH MINERALS) TABLET    Take 1 tablet by mouth daily.   Bailey Ashley  (Bailey Ashley) 40 MG TABLET    40 mg daily.   RANITIDINE (ZANTAC) 300 MG CAPSULE  Take 300 mg by mouth.   RIVAROXABAN (Bailey Ashley) 20 MG TABS TABLET    Take 20 mg by mouth daily with supper.   SERTRALINE (Bailey Ashley) 100 MG TABLET    Take 50 mg by mouth daily.   SIMVASTATIN (ZOCOR) 40 MG TABLET    Take 40 mg by mouth.   TRAMADOL (ULTRAM) 50 MG TABLET    Take 50 mg by mouth at bedtime as needed.   TRAZODONE (DESYREL) 150 MG TABLET    150 mg at bedtime.  Modified Medications   Modified Medication Previous Medication   TIOTROPIUM BROMIDE-OLODATEROL (Bailey Ashley RESPIMAT) 2.5-2.5 MCG/ACT AERS Tiotropium Bromide-Olodaterol (Bailey Ashley RESPIMAT) 2.5-2.5 MCG/ACT AERS      Inhale 1 puff into the lungs daily.    Inhale 1 puff into the lungs daily.  Discontinued Medications

## 2017-09-18 NOTE — Patient Instructions (Signed)
Today we updated your med list in our EPIC system...    Continue your current medications the same...  Today we wrote for a ZPak & gave you some refills to use this winter as needed for infection...  We refilled your STIOLTO as well- stay on the once daily...  Remember to use the Lorazepam 1mg  - try 1/2 tab twice daily if needed for the shortness of breath...  Stay active & work on an indoor exercise program for the coming winter months...  Call for any questions...  Let's plan a follow up visit in 35mo, sooner if needed for problems.Marland KitchenMarland Kitchen

## 2017-10-07 DIAGNOSIS — I10 Essential (primary) hypertension: Secondary | ICD-10-CM | POA: Diagnosis not present

## 2017-10-07 DIAGNOSIS — Z8709 Personal history of other diseases of the respiratory system: Secondary | ICD-10-CM | POA: Diagnosis not present

## 2017-10-07 DIAGNOSIS — K219 Gastro-esophageal reflux disease without esophagitis: Secondary | ICD-10-CM | POA: Diagnosis not present

## 2017-10-07 DIAGNOSIS — Z86711 Personal history of pulmonary embolism: Secondary | ICD-10-CM | POA: Diagnosis not present

## 2017-10-07 DIAGNOSIS — E02 Subclinical iodine-deficiency hypothyroidism: Secondary | ICD-10-CM | POA: Diagnosis not present

## 2017-10-07 DIAGNOSIS — D171 Benign lipomatous neoplasm of skin and subcutaneous tissue of trunk: Secondary | ICD-10-CM | POA: Diagnosis not present

## 2017-10-07 DIAGNOSIS — I480 Paroxysmal atrial fibrillation: Secondary | ICD-10-CM | POA: Diagnosis not present

## 2017-10-07 DIAGNOSIS — Z8673 Personal history of transient ischemic attack (TIA), and cerebral infarction without residual deficits: Secondary | ICD-10-CM | POA: Diagnosis not present

## 2017-10-07 DIAGNOSIS — R079 Chest pain, unspecified: Secondary | ICD-10-CM | POA: Diagnosis not present

## 2017-10-29 DIAGNOSIS — R251 Tremor, unspecified: Secondary | ICD-10-CM | POA: Diagnosis not present

## 2017-10-29 DIAGNOSIS — R2689 Other abnormalities of gait and mobility: Secondary | ICD-10-CM | POA: Diagnosis not present

## 2017-10-29 DIAGNOSIS — R42 Dizziness and giddiness: Secondary | ICD-10-CM | POA: Diagnosis not present

## 2017-10-29 DIAGNOSIS — G20 Parkinson's disease: Secondary | ICD-10-CM | POA: Diagnosis not present

## 2017-11-01 IMAGING — DX DG CHEST 2V
2 series · 2 of 2 positions shown · non-contrast
Comparison: 03/11/2016

CLINICAL DATA: Shortness of Breath

EXAM:
CHEST  2 VIEW

[chest pa]
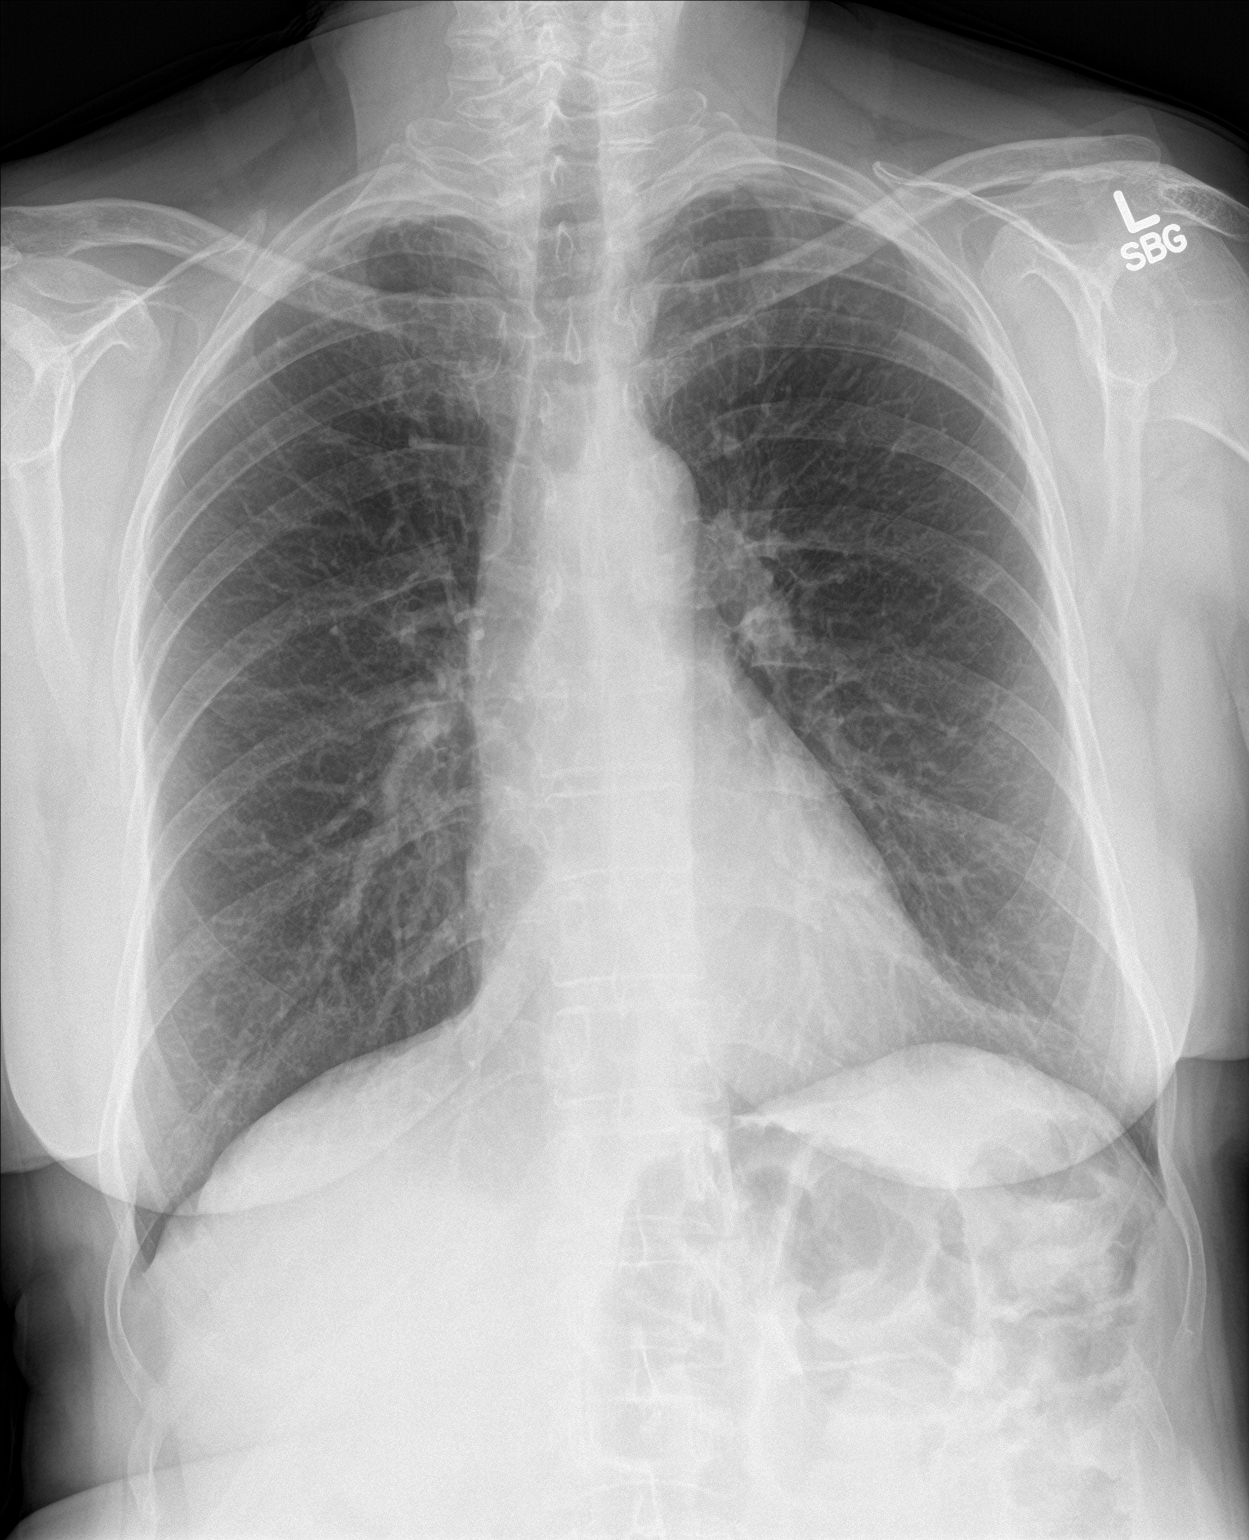

[chest lat]
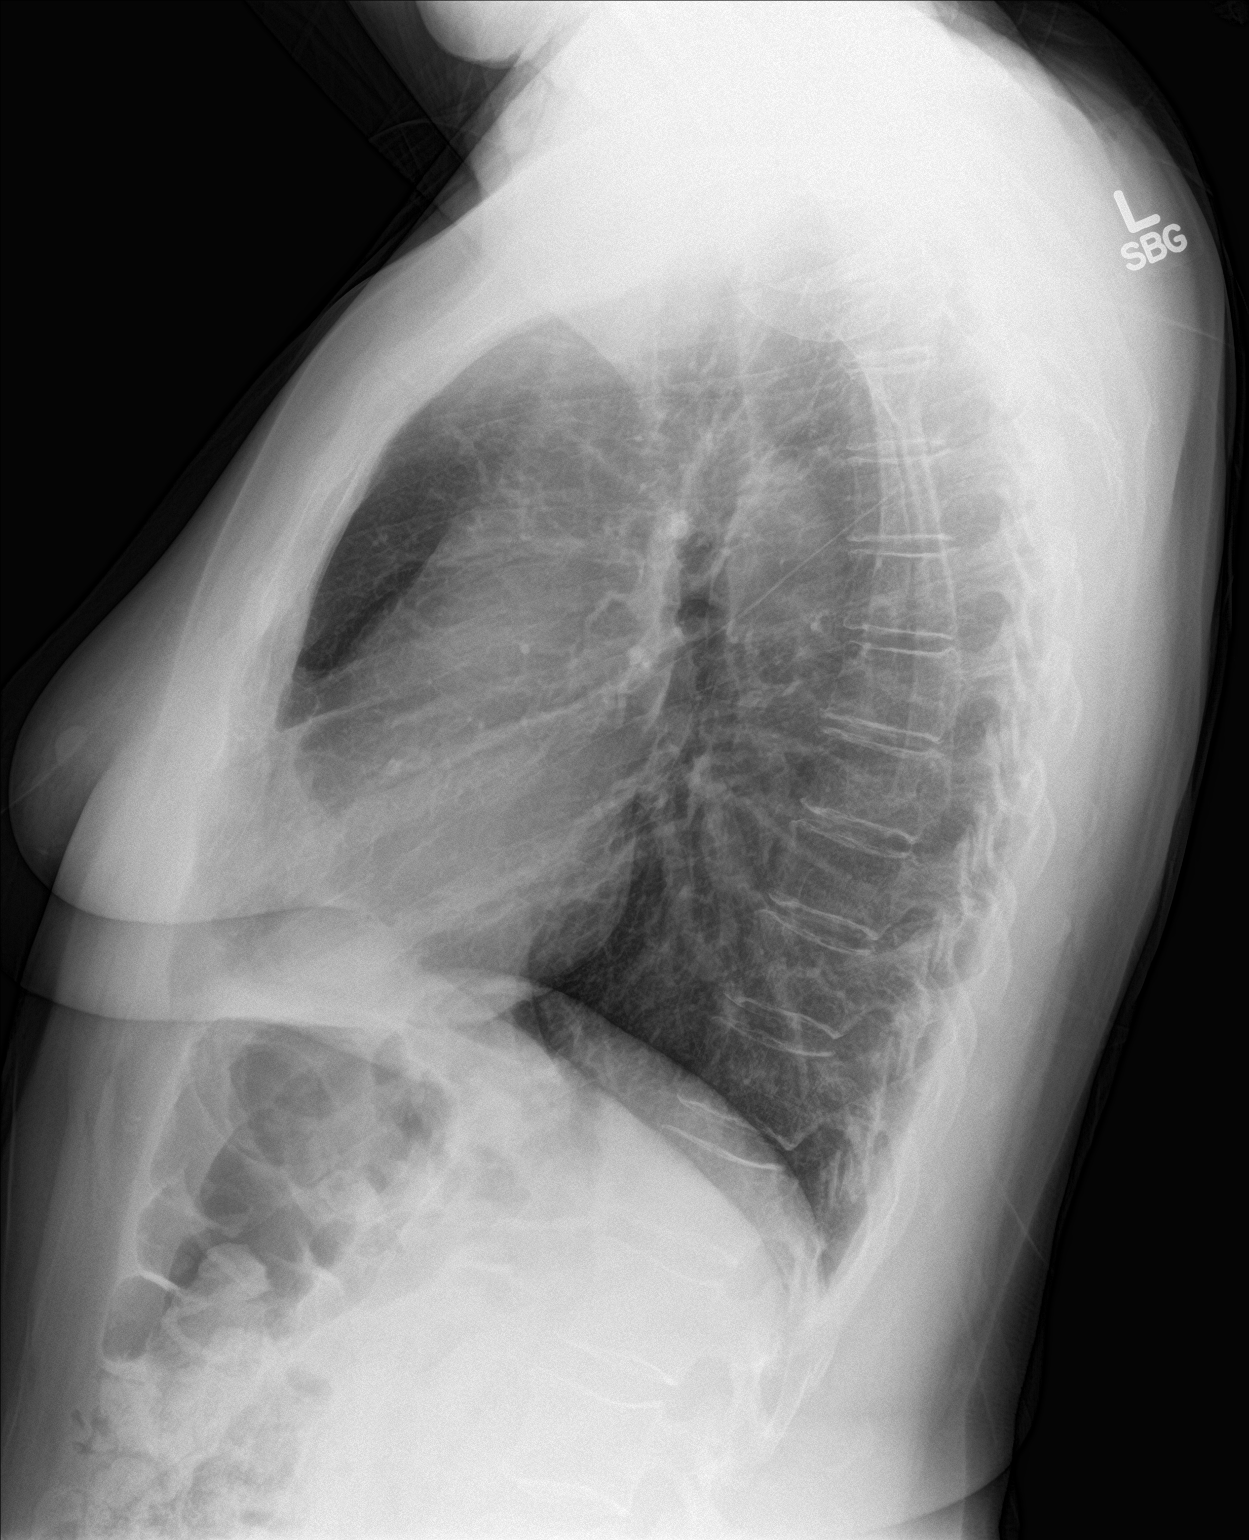

[2 of 2 positions shown; findings below may reference images not displayed]

FINDINGS: The heart size and mediastinal contours are within normal limits.
Lungs are well aerated bilaterally. Previously seen nodular change
in the right apex is less well appreciated. No focal infiltrate or
sizable effusion is seen. No bony abnormality is noted.
IMPRESSION: No active cardiopulmonary disease.

## 2017-11-03 DIAGNOSIS — G20 Parkinson's disease: Secondary | ICD-10-CM | POA: Diagnosis not present

## 2017-11-03 DIAGNOSIS — R42 Dizziness and giddiness: Secondary | ICD-10-CM | POA: Diagnosis not present

## 2017-11-03 DIAGNOSIS — R2689 Other abnormalities of gait and mobility: Secondary | ICD-10-CM | POA: Diagnosis not present

## 2017-12-09 DIAGNOSIS — Z8673 Personal history of transient ischemic attack (TIA), and cerebral infarction without residual deficits: Secondary | ICD-10-CM | POA: Diagnosis not present

## 2017-12-09 DIAGNOSIS — I10 Essential (primary) hypertension: Secondary | ICD-10-CM | POA: Diagnosis not present

## 2017-12-09 DIAGNOSIS — E02 Subclinical iodine-deficiency hypothyroidism: Secondary | ICD-10-CM | POA: Diagnosis not present

## 2017-12-09 DIAGNOSIS — I480 Paroxysmal atrial fibrillation: Secondary | ICD-10-CM | POA: Diagnosis not present

## 2017-12-09 DIAGNOSIS — Z86711 Personal history of pulmonary embolism: Secondary | ICD-10-CM | POA: Diagnosis not present

## 2017-12-09 DIAGNOSIS — G20 Parkinson's disease: Secondary | ICD-10-CM | POA: Diagnosis not present

## 2017-12-09 DIAGNOSIS — Z8709 Personal history of other diseases of the respiratory system: Secondary | ICD-10-CM | POA: Diagnosis not present

## 2017-12-11 DIAGNOSIS — R9431 Abnormal electrocardiogram [ECG] [EKG]: Secondary | ICD-10-CM | POA: Diagnosis not present

## 2017-12-25 DIAGNOSIS — L720 Epidermal cyst: Secondary | ICD-10-CM | POA: Diagnosis not present

## 2017-12-25 DIAGNOSIS — L820 Inflamed seborrheic keratosis: Secondary | ICD-10-CM | POA: Diagnosis not present

## 2018-01-20 DIAGNOSIS — E559 Vitamin D deficiency, unspecified: Secondary | ICD-10-CM | POA: Diagnosis not present

## 2018-01-20 DIAGNOSIS — Z79899 Other long term (current) drug therapy: Secondary | ICD-10-CM | POA: Diagnosis not present

## 2018-01-20 DIAGNOSIS — Z6826 Body mass index (BMI) 26.0-26.9, adult: Secondary | ICD-10-CM | POA: Diagnosis not present

## 2018-01-20 DIAGNOSIS — I10 Essential (primary) hypertension: Secondary | ICD-10-CM | POA: Diagnosis not present

## 2018-01-20 DIAGNOSIS — E669 Obesity, unspecified: Secondary | ICD-10-CM | POA: Diagnosis not present

## 2018-01-20 DIAGNOSIS — M109 Gout, unspecified: Secondary | ICD-10-CM | POA: Diagnosis not present

## 2018-01-20 DIAGNOSIS — J449 Chronic obstructive pulmonary disease, unspecified: Secondary | ICD-10-CM | POA: Diagnosis not present

## 2018-01-20 DIAGNOSIS — Z8673 Personal history of transient ischemic attack (TIA), and cerebral infarction without residual deficits: Secondary | ICD-10-CM | POA: Diagnosis not present

## 2018-01-20 DIAGNOSIS — K219 Gastro-esophageal reflux disease without esophagitis: Secondary | ICD-10-CM | POA: Diagnosis not present

## 2018-01-20 DIAGNOSIS — E039 Hypothyroidism, unspecified: Secondary | ICD-10-CM | POA: Diagnosis not present

## 2018-01-20 DIAGNOSIS — E785 Hyperlipidemia, unspecified: Secondary | ICD-10-CM | POA: Diagnosis not present

## 2018-01-20 DIAGNOSIS — I4891 Unspecified atrial fibrillation: Secondary | ICD-10-CM | POA: Diagnosis not present

## 2018-02-11 DIAGNOSIS — M8589 Other specified disorders of bone density and structure, multiple sites: Secondary | ICD-10-CM | POA: Diagnosis not present

## 2018-02-11 DIAGNOSIS — Z1231 Encounter for screening mammogram for malignant neoplasm of breast: Secondary | ICD-10-CM | POA: Diagnosis not present

## 2018-02-25 DIAGNOSIS — R2689 Other abnormalities of gait and mobility: Secondary | ICD-10-CM | POA: Diagnosis not present

## 2018-02-25 DIAGNOSIS — G20 Parkinson's disease: Secondary | ICD-10-CM | POA: Diagnosis not present

## 2018-02-25 DIAGNOSIS — R251 Tremor, unspecified: Secondary | ICD-10-CM | POA: Diagnosis not present

## 2018-03-18 ENCOUNTER — Ambulatory Visit (INDEPENDENT_AMBULATORY_CARE_PROVIDER_SITE_OTHER): Payer: Medicare Other | Admitting: Pulmonary Disease

## 2018-03-18 ENCOUNTER — Encounter: Payer: Self-pay | Admitting: Pulmonary Disease

## 2018-03-18 VITALS — BP 120/64 | HR 82 | Temp 97.8°F | Ht 70.0 in | Wt 181.6 lb

## 2018-03-18 DIAGNOSIS — F411 Generalized anxiety disorder: Secondary | ICD-10-CM | POA: Diagnosis not present

## 2018-03-18 DIAGNOSIS — Z8709 Personal history of other diseases of the respiratory system: Secondary | ICD-10-CM

## 2018-03-18 DIAGNOSIS — I480 Paroxysmal atrial fibrillation: Secondary | ICD-10-CM | POA: Diagnosis not present

## 2018-03-18 DIAGNOSIS — K219 Gastro-esophageal reflux disease without esophagitis: Secondary | ICD-10-CM

## 2018-03-18 DIAGNOSIS — R059 Cough, unspecified: Secondary | ICD-10-CM

## 2018-03-18 DIAGNOSIS — R0600 Dyspnea, unspecified: Secondary | ICD-10-CM | POA: Diagnosis not present

## 2018-03-18 DIAGNOSIS — R05 Cough: Secondary | ICD-10-CM | POA: Diagnosis not present

## 2018-03-18 NOTE — Progress Notes (Signed)
Subjective:     Patient ID: Bailey Ashley, female   DOB: 09-Apr-1946, 72 y.o.   MRN: [id number]  HPI 72 y/o WF referred for eval of dyspnea> mostly c/o DOE & she's very sedentary/deconditioned, also notes trouble getting a deep breath or getting enough air "IN"; Spirometry & oxygenation OK; she also has signif reflux x yrs and placed on a strict antireflux regimen  ~  April 04, 2015:  Initial consult by SN>   43 y/o woman referred by [facility]ppin of [facility] for evaluation of dyspnea; she is here w/ her daughter, [name] she reports approx 64yr hx COPD/"asthma" w/ SOB/ DOE, some cough & wheezing, all worse over the last 52mo; she notes difficulty shopping, cleaning house, climbing stairs, "any activity"; says mild cough, no sput or hemoptysis, but notes "wheezing, chest tight";  She states that "I stay cold" she's on blood thinners and got herself a rolling walker "this helps"; on further questioning she notes that it seems hard to get the air "in", can't get a deep breath, not satisfied w/ her breathing & freq sighs; she admits to some anxiety- "I get worked up" she says, but she's been on Alprazolam 0.5mg  Tid & no better by her hx...     Smoking Hx> she started in her teens and quit in her 26s (over 28 yrs ago) but she did smoke up to 2ppd for <20 pack-yr smoking hx...    Reflux> she has hx signif reflux for yrs she says & takes Ranitadine for this...    Current meds>  Symbicort80-2spBid, ?Qvar80-2spBid, NEB w/ Duoneb Tid, Singulair10Qhs...    PMHx includes prev hx PE and cardiac hx (followed by [facility] in [place] but we don't have their records), ?CHF, she says they did ABG, CXR, ONO via [place] pending...    FamHx reveals that her husb died 5 yrs ago w/ severe COPD & pancreatic cancer, he was an Chief Financial Officer in the [place] for 88yrs spent [place] in the middle [place], then [place] home (very stressful for pt)...  EXAM reveals Afeb, VSS, O2sat=100% on RA;  Heent- neg, mallampati2;   Chest- clear w/o w/r/r;  Cor- RR w/o m/r/g;  Ext- w/o c/c/e...  CXR 03/16/15 in [place] showed heart wnl in size, lungs clear, NAD...   ABG by [facility] 03/16/15 showed pH=7.41, pCO2=39, pO2=67 on RA  Spirometry 04/04/15 showed FVC=3.32 (90%), FEV1=2.46 (88%), %1sec=74, mid-flows were wnl at 97%predicted...  Ambulatory oxygen test> O2sat= 98% on RA at rest w/ pulse=126/min;  She walked 1 lap w/ lowest O2sat=97% w/ pulse=97% (stopped due to pain & "giving out"  LABS from [facility] reviewed> IgE level=9 and all RAST tests NEG;  IMP/PLAN>> we discussed her dyspnea, esp the sensation of "can't get the air in" and lack of objective abnormalities; she is no better on her Alprazolam- therefore suggest switch to LORAZEPAM 1mg Tid;  I feel it would be helpful to establish w/ a good counselor as well, in the meantime try the new Zoloft called in by DrUppin;  She may continue to use her NEBs as needed, and rec starting an exercise program;  In addition we will start PANTOPRAZOLE for her reflux symptoms and an antireflux regimen... Plan ROV 83mo for recheck...  ~  May 04, 2015:  48mo ROV w/ SN>  Bailey Ashley was seen 57mo ago w/ asthma, SOB w/ any activity, wheezing & tight in the chest, anxiety related to the passing of her husb several yrs ago; we adjusted her meds in the  interval to account for her insurance coverage to [employer] 1/d, Qvar80-2spBid, Lorazepam1mg Tid, ProtonixQd, and counseling (which she did not do); she reports much better on these meds- able to get deeper breath, feels more satisfied breathing, etc;  She is now ambulating w/ her 4pt cane & not the walker, doing some housework and in the yard... EXAM remains clear & she is in better spirits...  We discussed ROV in 29mo sooner if needed prn...  ~  September 12, 2015:  72mo ROV w/ SN>  Bailey Ashley returns feeling well & doing satis; she had a summer vacation in [place] visiting family- she did well x for some trouble w/ the pollen and one sudden syncopal spell, seen  by physician in [place] & nothing found, she denies similat prob before or since;  The pollen has recently caused some sneezing, cough w/ yellow mucus, & congestion- we discussed Rx w/ ZPak, Claritin/ Flonase, Mucinex, Delsym... We reviewed the following medical problems during today's office visit >>     Dyspnea- likely multifactorial w/ anxiety, chest wall factors, reflux playing a roll => improved on Rx & reminded about exercise...    Hx Asthma> on Stiolto- one puff daily & ProairHFA prn; doing satis w/o wheezing, tightness, etc...    Hx PE> on Xarelto per [facility] in [place], we do not have their records...    Hx CHF> followed by [facility] in [place], we do not have their records, Echo results, etc; pt not on any other cardiac meds....    Hyperlipid> on Simva40 per PCP in [place]    GERD> on Protonix40 & Ranitadine 300mg bid; we reviewed the need for antireflux regimen (elev HOB, NPO after dinner, etc)...    Hypothyroid> on Synthroid125 & followed by her PCP...    Anxiety/ Depression> on Lorazepam 1mg - 1/2 to 1 tab Tid & Zoloft100- 1/2 tab daily; husb passed away 44yrs ago & very stressful for pt... EXAM reveals Afeb, VSS, O2sat=99% on RA;  Heent- neg, mallampati2;  Chest- clear w/o w/r/r;  Cor- RR w/o m/r/g;  Ext- w/o c/c/e... IMP/PLAN>>  We wrote for a ZPak 7 some refills for prn use; she will continue w/ the Stiolto, Lorazepam, and Zoloft; we reviewed the need for a progressive exercise program; plan ROV in 58mo, sooner if needed prn...  ~  March 11, 2016:  69mo ROV & Bailey Ashley indicates that she is doing satis just noting some recent throat clearing, sl cough w/o sput, no CP x w/ coughing & denies much SOB x w/ exertion;  She does her ADLs ok, does her shopping ok (sometimes takes the motorized cart), and ambulates w/ her cane;  She needs incr exercise program but she complains that insurance won't pay for silver sneakers- I told her she needs to cover the cost of exercise program on her own, it's THAT  important (states the Y is too expensive "it's $40 per month!")... She remains on Stiolto daily, Singulair10, Flonase, and AlbutHFA prn; she has the Ativan1mg  as well...     EXAM reveals Afeb, VSS, O2sat=98% on RA;  Heent- neg, mallampati2;  Chest- clear w/o w/r/r;  Cor- RR w/o m/r/g;  Abd- soft, nontender, neg;  Ext- w/o c/c/e;  Neuro- gait abn, otherw nonfocal...  CXR 03/11/16>  Norm heart size, tiny RUL nodule , biapical scarring, and mild atx in right mid lung zone; osteopenia in spine => I called pt & rec CT Chest for further eval...  IMP/PLAN>>  She appears stable but I believe could be better  if she increased her exercise program- clearly needs some supervision as in a silver sneaker program or joining the Y etc... CXR today shows some chr changes & ?tiny nodule in RUL area- suggest CT Chest for further eval... Rec to continue her Stiolto daily + the Singulair, Flonase, & the AlbutHFA rescue inhaler prn... She can use cough drops, throat lozenges, Delsym & Tramadol as needed for the cough;  We wrote for some Hycodan for prn use at night...  ADDENDUM>  CT Chest 03/14/16 showed some atherosclerotic dis in LAD, calcif/granulomatous left suprahilar node, mild reticulonodular opac in lung apicies w/ nodular component ~93mm (corresponds to CXR) & likely represents scar tissue, left renal cyst, & benign splenic lymphangioma; Radiology felt that f/u imaging not nec...   ~  September 04, 2016:  37mo ROV & pulmonary follow up visit> Bailey Ashley notes a sl dry cough 7 wonders if one of her AFib meds might be making her cough (she is NOT on an ACE or ARB);  She continues on Stiolto-2sp/d, Singulair10, AlbutHFA rescue prn (hasn't needed), and Hycodan prn;  She also has Loazepam 1mg -1/2 to 1 tab tid prn, along w/ Zoloft & Trazadone... Her PCP is DrUppin in [place] her Cardiology team is there too- unfortunately we do not have any notes from these providers in epic...  She reports that was was going to a local gym but  stopped when she had the onset of AFib; now cards has released her to return to exercise program & she will start back in the [employer] program...    Dyspnea- likely multifactorial w/ anxiety, chest wall factors, reflux playing a roll => improved on Rx w/ Lorazepam, Stiolto, Singulair, Mucinex, etc...    Hx Asthma> on Stiolto- one puff daily & ProairHFA prn; doing satis w/o wheezing, tightness, etc...    Hx PE> on Xarelto per [facility] in [place], we do not have their records...    New onset AFib, Hx CHF> followed by [facility] in [place], we do not have their records, Echo results, etc; currently taking ?Flecainide, Xarelto, [id number] & asked to get notes for [place] to scan into Epic.    Hyperlipid> on Simva40 per PCP in [place]    GERD> on Protonix40 & Ranitadine 300mg bid; we reviewed the need for antireflux regimen (elev HOB, NPO after dinner, etc)...    Hypothyroid> on Synthroid125 & followed by her PCP...    Anxiety/ Depression> on Lorazepam 1mg - 1/2 to 1 tab Tid & Zoloft100- 1/2 tab daily; husb passed away 50yrs ago & very stressful for pt... EXAM reveals Afeb, VSS, O2sat=100% on RA;  Heent- neg, mallampati2;  Chest- clear w/o w/r/r;  Cor- RR w/o m/r/g;  Ext- w/o c/c/e... IMP/PLAN>>  We refilled Lorazepam, and rec continuing Stiolto, Singulair, AlbutHFA, Mucinex, fluids, and Delsym... Call for any problems.  ~  March 18, 2017:  56mo ROV & pulm follow up visit>  Bailey Ashley reports having a URI 59mo ago, seen by her PCP & treated w/ ZPak=> improved but c/o some cough, discolored sput, but denies f/c/s, etc... Overall she says "pretty good" We reviewed the following medical problems during today's office visit >>     Dyspnea- likely multifactorial w/ asthma, anxiety, chest wall factors, reflux playing a roll => improved on Rx w/ Lorazepam, Stiolto, Singulair, Mucinex, etc...    Hx allergic rhinitis> on Singulair10, Flonase, and has been treated for sinusitis...    Hx Asthma> on Stiolto- one puff daily &  ProairHFA prn; doing satis w/o wheezing, tightness,  etc...    Hx PE> on Xarelto per [facility] in [place], we do not have their records...    AFib, Hx CHF> followed by [facility] in [place], we do not have their records, Echo results, etc; currently taking ?Flecainide, Xarelto, [id number] & asked to get notes for [place] to scan into Epic (none yet); she is holding NSR- pt reports "everything is fine" and CP "resolved on it's own"... She needs a regular exercise program...    Hyperlipid> on Simva40 per PCP- DrUppin in [place]    GERD> on Protonix40 & Ranitadine 300mg bid; we reviewed the need for antireflux regimen (elev HOB, NPO after dinner, etc)...    Hypothyroid> on Synthroid125 & followed by her PCP...    Anxiety/ Depression> on Lorazepam 1mg - 1/2 to 1 tab Tid & Zoloft100- 1/2 tab daily; husb passed away 50yrs ago & very stressful for pt... EXAM reveals Afeb, VSS, O2sat=98% on RA;  Heent- neg, mallampati2;  Chest- clear w/o w/r/r;  Cor- RR w/o m/r/g;  Ext- w/o c/c/e...  CXR 03/18/17 (independently reviewed by me in the PACS system) shows norm heart size, clear lungs, prev reported nodular changes RUL is less well apprec, no focal infiltrates- NAD... IMP/PLAN>>  Bailey Ashley is stable, notes occas URIs, improved w/ ZPak;  CXR is clear- NAD;  Rec to continue Stiolto regularly- needs improved exercise program etc; she is asked to get records from [place] for [place] to scan into Epic...  ~  September 18, 2017:  67mo ROV & Bailey Ashley notes mild cough, green sput production for several days and incr SOB/DOE which is actually pretty stable over the last yr or more; she denies f/c/s, wt stable, and otherw about the same...    She has multifactorial dyspnea- improved & stable on prn Lorazepam & reminded that she can take it more regularly if needed;  Her asthma is controlled on Stiolto one inhalation daily ([facility]);      She saw [facility]- DrFolk in [place] 08/14/17> f/u HBP & AFib on Flecainide100Bid, and Xarelto20;  they placed an event monitor & so far it has demonstated bouts of AFib w/ rvr=> added CartiaXT120...    EXAM reveals Afeb, VSS, O2sat=99% on RA;  Heent- neg, mallampati2;  Chest- clear w/o w/r/r;  Cor- RR w/o m/r/g;  Ext- w/o c/c/e;  Neuro- intact w/o focal deficits... IMP/PLAN>>  Klare has a mild bronchitic exac & she has reponded well to Zithromax in the past=> REC ZPak and continue Stiolto daily (NOTE- she could alsways incr to 2 inhalations/d if needed); use OTC Mucinex and AlbutHFA rescue inhaler as needed; OK 2018 flu vaccine today...   ~  March 18, 2018:  72mo ROV & Bailey Ashley reports breathing well w/o new complaints or concerns; she remains on Stiolto- one puff daily, Singulair10, Flonase Qhs and AlbutHFA rescue inhaler as needed (ave 1-2 per month);  She denies cough, sput, hemptysis, notes her SOB is improved overall & she's been walking w/ a 4-pt cane at home & using an exercise bike...     Her PCP is DrUppin in [place] she tells me she was last seen ~6mo ago & no changes in her meds...    Her Cardiologist is also in [place] she reports being seen ~11/2017, holding NSR, wore a monitor, same meds- no changes made...     She also saw NEURO in at [facility] & was started on Sinemet for Parkinson's dis... We reviewed the following medical problems during today's office visit>  Dyspnea- likely multifactorial w/ asthma, anxiety, chest wall factors, reflux playing a roll => improved on Rx w/ Lorazepam, Stiolto, Singulair, Mucinex, etc...    Hx allergic rhinitis> on Singulair10, Flonase, and has been treated for sinusitis...    Hx Asthma> on Stiolto- one puff daily & ProairHFA prn; doing satis w/o wheezing, tightness, etc...    Hx PE> on Xarelto per [facility] in [place], we do not have their records...    AFib, Hx CHF> followed by [facility] in [place], we do not have their records, Echo results, etc; currently taking ?Flecainide, Xarelto, [id number] & asked to get notes for [place] to scan into Epic (none  yet); she is holding NSR- pt reports "everything is fine" and CP "resolved on it's own"... She needs a regular exercise program...    Hyperlipid> on Simva40 per PCP- DrUppin in [place]    GERD> on Protonix40 & Ranitadine 300mg bid; we reviewed the need for antireflux regimen (elev HOB, NPO after dinner, etc)...    Hypothyroid> on Synthroid125 & followed by her PCP...    Anxiety/ Depression> on Lorazepam 1mg - 1/2 to 1 tab Tid & Zoloft100- 1/2 tab daily; husb passed away 30yrs ago & very stressful for pt... EXAM reveals Afeb, VSS, O2sat=97% on RA;  Heent- neg, mallampati2;  Chest- clear w/o w/r/r;  Cor- RR w/o m/r/g;  Ext- w/o c/c/e;  Neuro- intact w/o focal deficits... IMP/PLAN>>  Elif remains stable on her Stiolto, Singulair, Flonase, AlbutHFA;  Asked to continue same and she requests rov in 67mo...     Past Medical History  Diagnosis Date  . Asthma   . GERD (gastroesophageal reflux disease) >> on Protonix40 & Ranitadine 300 Bid   . Hyperlipidemia >> on Simva40 Qhs   . Hypothyroid >> on Levothy125   . Osteoporosis   . Depression >> on ZOLOFT100   . Psoriasis   . Ulnar neuropathy   . Constipation   . Hypertension >. Prev on Atenolol25- off now    . Bilateral hip pain >> on Tramadol50 prn   . Edema   . Tachycardia   . Congestive heart failure >> on ASA81; prev on Lasix40- 2daily & K20Bid- off now   . History of smoking   . Grief reaction >> on Ativan 1mg  tid prn   . Gout >> on Allopurinol100   . Pulmonary embolism     Past Surgical History:  Procedure Laterality Date  . Cataract surgery    . NASAL SINUS SURGERY      Outpatient Encounter Prescriptions as of 03/18/18  Medication Sig  . albuterol (PROVENTIL HFA;VENTOLIN HFA) 108 (90 BASE) MCG/ACT inhaler Inhale 2 puffs into the lungs every 6 (six) hours as needed for wheezing or shortness of breath.  . allopurinol (ZYLOPRIM) 100 MG tablet   . aspirin 81 MG chewable tablet Chew 81 mg by mouth daily.  . baclofen (LIORESAL) 10 MG  tablet Take 10 mg by mouth 2 (two) times daily as needed for muscle spasms.  Marland Kitchen diltiazem (CARDIZEM CD) 120 MG 24 hr capsule Take 120 mg by mouth daily.  . flecainide (TAMBOCOR) 100 MG tablet Take 100 mg by mouth 2 (two) times daily.  . fluticasone (FLONASE) 50 MCG/ACT nasal spray   . HYDROcodone-homatropine (HYCODAN) 5-1.5 MG/5ML syrup Take 5 mLs by mouth every 6 (six) hours as needed for cough.  . levothyroxine (SYNTHROID, LEVOTHROID) 125 MCG tablet 125 mcg daily.  Marland Kitchen LORazepam (ATIVAN) 1 MG tablet Take 0.5-1 tablets (0.5-1 mg total) by mouth 3 (three) times daily  as needed for anxiety.  . meclizine (ANTIVERT) 12.5 MG tablet   . montelukast (SINGULAIR) 10 MG tablet Take 10 mg by mouth.  . Multiple Vitamins-Minerals (MULTIVITAMIN WITH MINERALS) tablet Take 1 tablet by mouth daily.  . pantoprazole (PROTONIX) 40 MG tablet 40 mg daily.  . ranitidine (ZANTAC) 300 MG capsule Take 300 mg by mouth.  . rivaroxaban (XARELTO) 20 MG TABS tablet Take 20 mg by mouth daily with supper.  . sertraline (ZOLOFT) 100 MG tablet Take 50 mg by mouth daily.  . simvastatin (ZOCOR) 40 MG tablet Take 40 mg by mouth.  . Tiotropium Bromide-Olodaterol (STIOLTO RESPIMAT) 2.5-2.5 MCG/ACT AERS Inhale 1 puff into the lungs daily.  . traMADol (ULTRAM) 50 MG tablet Take 50 mg by mouth at bedtime as needed.  . traZODone (DESYREL) 150 MG tablet 150 mg at bedtime.    Allergies  Allergen Reactions  . Contrast Media [Iodinated Diagnostic Agents] Shortness Of Breath  . Sulfa Antibiotics Hives    Current Medications, Allergies, Past Medical History, Past Surgical History, Family History, and Social History were reviewed in [employer] record.   Review of Systems          All symptoms NEG except where BOLDED >>  Constitutional:  F/C/S, fatigue, anorexia, unexpected weight change. HEENT:  HA, visual changes, hearing loss, earache, nasal symptoms, sore throat, mouth sores, hoarseness. Resp:  cough,  sputum, hemoptysis; SOB, tightness, wheezing. Cardio:  CP, palpit, DOE, orthopnea, edema. GI:  N/V/D/C, blood in stool; reflux, abd pain, distention, gas. GU:  dysuria, freq, urgency, hematuria, flank pain, voiding difficulty. MS:  joint pain, swelling, tenderness, decr ROM; neck pain, back pain, etc. Neuro:  HA, tremors, seizures, dizziness, syncope, weakness, numbness, gait abn. Skin:  suspicious lesions or skin rash. Heme:  adenopathy, bruising, bleeding. Psyche:  confusion, agitation, sleep disturbance, hallucinations, anxiety, depression suicidal.   Objective:   Physical Exam    Vital Signs:  Reviewed...   General:  WD, WN, 72 y/o WF in NAD; alert & oriented; pleasant & cooperative... HEENT:  /AT; Conjunctiva- pink, Sclera- nonicteric, EOM-wnl, PERRLA, EACs-clear, TMs-wnl; NOSE-clear; THROAT-clear & wnl.  Neck:  Supple w/ fair ROM; no JVD; normal carotid impulses w/o bruits; no thyromegaly or nodules palpated; no lymphadenopathy.  Chest:  Clear to P & A; without wheezes, rales, or rhonchi heard. Heart:  Regular Rhythm; norm S1 & S2 without murmurs, rubs, or gallops detected. Abdomen:  Soft & nontender- no guarding or rebound; normal bowel sounds; no organomegaly or masses palpated. Ext:  Normal ROM; without deformities, +arthritic changes; no varicose veins,+ venous insuffic, tr edema;  Pulses intact w/o bruits. Neuro:  CNs II-XII intact; motor testing normal; sensory testing normal; gait normal & balance OK. Derm:  No lesions noted; no rash etc. Lymph:  No cervical, supraclavicular, axillary, or inguinal adenopathy palpated.   Assessment:      IMP >>    Dyspnea- likely multifactorial w/ anxiety, chest wall factors, reflux playing a roll...     Hx AR, sinusitis    Hx Asthma    ?tiny RUL nodule on CXR 02/2016=> CXR improved 02/2017...    Hx CHF    GERD    Anxiety/ Depression    Other medical issues as noted...  PLAN >>  05/04/15> She is much improved on Stiolto 1/d,  Qvar80-2spBid, Protonix40, and Lorazepam1mg Tid... Rec to continue same for now and we will plan to slowly wean over time... 09/12/15> We wrote for a ZPak & some refills for prn use;  she will continue w/ the Stiolto, Lorazepam, and Zoloft; we reviewed the need for a progressive exercise program...  03/11/16> Appears stable on Stiolto, Singulair, Flonase, AlbutHFA but she is still not exercising (to help her DOE) & c/o sl cough & reflux; CXR here shows ?tiny RUL nodule & we will check CT; needs better exercise program and antireflux regimen... 09/04/16>   We refilled Lorazepam, and rec continuing Stiolto, Singulair, AlbutHFA, Mucinex, fluids, and Delsym... Call for any problems 03/18/17>   Bailey Ashley is stable, notes occas URIs, improved w/ ZPak;  CXR is clear- NAD;  Rec to continue Stiolto regularly- needs improved exercise program etc; she is asked to get records from [facility] for [place] to scan into Epic. 09/18/17>   Bailey Ashley has a mild bronchitic exac & she has reponded well to Zithromax in the past=> REC ZPak and continue Stiolto daily (NOTE- she could alsways incr to 2 inhalations/d if needed); use OTC Mucinex and AlbutHFA rescue inhaler as needed; OK 2018 flu vaccine today... 3/37/19>  Bailey Ashley remains stable on her Stiolto, Singulair, Flonase, AlbutHFA;  Asked to continue same and she requests rov in 79mo.    Plan:     Patient's Medications  New Prescriptions   No medications on file  Previous Medications   ALBUTEROL (PROVENTIL HFA;VENTOLIN HFA) 108 (90 BASE) MCG/ACT INHALER    Inhale 2 puffs into the lungs every 6 (six) hours as needed for wheezing or shortness of breath.   ALLOPURINOL (ZYLOPRIM) 100 MG TABLET       ASPIRIN 81 MG CHEWABLE TABLET    Chew 81 mg by mouth daily.   BACLOFEN (LIORESAL) 10 MG TABLET    Take 10 mg by mouth 2 (two) times daily as needed for muscle spasms.   CARBIDOPA-LEVODOPA (SINEMET IR) 25-100 MG TABLET    Take 1 tablet by mouth 4 (four) times daily.   DILTIAZEM (CARDIZEM CD)  120 MG 24 HR CAPSULE    Take 120 mg by mouth daily.   FLECAINIDE (TAMBOCOR) 100 MG TABLET    Take 100 mg by mouth 2 (two) times daily.   FLUTICASONE (FLONASE) 50 MCG/ACT NASAL SPRAY       LEVOTHYROXINE (SYNTHROID, LEVOTHROID) 150 MCG TABLET    150 mcg daily.    LORAZEPAM (ATIVAN) 1 MG TABLET    Take 0.5-1 tablets (0.5-1 mg total) by mouth 3 (three) times daily as needed for anxiety.   MECLIZINE (ANTIVERT) 12.5 MG TABLET       MONTELUKAST (SINGULAIR) 10 MG TABLET    Take 10 mg by mouth.   MULTIPLE VITAMINS-MINERALS (MULTIVITAMIN WITH MINERALS) TABLET    Take 1 tablet by mouth daily.   PANTOPRAZOLE (PROTONIX) 40 MG TABLET    40 mg daily.   RANITIDINE (ZANTAC) 300 MG CAPSULE    Take 300 mg by mouth.   RIVAROXABAN (XARELTO) 20 MG TABS TABLET    Take 20 mg by mouth daily with supper.   SERTRALINE (ZOLOFT) 100 MG TABLET    Take 50 mg by mouth daily.   SIMVASTATIN (ZOCOR) 40 MG TABLET    Take 40 mg by mouth.   TIOTROPIUM BROMIDE-OLODATEROL (STIOLTO RESPIMAT) 2.5-2.5 MCG/ACT AERS    Inhale 1 puff into the lungs daily.   TRAMADOL (ULTRAM) 50 MG TABLET    Take 50 mg by mouth at bedtime as needed.   TRAZODONE (DESYREL) 150 MG TABLET    150 mg at bedtime.  Modified Medications   No medications on file  Discontinued Medications   AZITHROMYCIN (ZITHROMAX)  250 MG TABLET    Take 2 today then 1 daily until gone   HYDROCODONE-HOMATROPINE (HYCODAN) 5-1.5 MG/5ML SYRUP    Take 5 mLs by mouth every 6 (six) hours as needed for cough.   TIOTROPIUM BROMIDE-OLODATEROL (STIOLTO RESPIMAT) 2.5-2.5 MCG/ACT AERS    Inhale 1 puff into the lungs daily.

## 2018-03-18 NOTE — Patient Instructions (Signed)
Today we updated your med list in our EPIC system...    Continue your current medications the same...  Continue the Stiolto one inhalation daily...  Continue your Singulair 10mg  daily...  Use your ALLEGRA 180mg  & Flonase for your allergies  Stay as active as possible-- it will help your breathing long term and your parkinson's dis...  Call for any questions...  Let's plan a follow up visit in 33mo, sooner if needed for problems.Marland KitchenMarland Kitchen

## 2018-04-21 ENCOUNTER — Telehealth: Payer: Self-pay | Admitting: Pulmonary Disease

## 2018-04-21 NOTE — Telephone Encounter (Signed)
PA request received from [facility] CMM Key: [id number] PA request has been sent to plan, and a determination is expected within 24-72 HOURS.   Routing to LT for follow-up.

## 2018-04-22 ENCOUNTER — Telehealth: Payer: Self-pay

## 2018-04-22 NOTE — Telephone Encounter (Signed)
[facility] PA request still pending.

## 2018-04-22 NOTE — Telephone Encounter (Signed)
Faxed received for PA on medication Lorazepam. Called and spoke with [facility] from [facility]. PA was initiated over the phone. PA was approved until 4.30.20  PA case # [id number]  Called and spoke with pharmacy to advise PA was approved. Called and spoke with patient. Advised that prescription has been approved and [facility] to [facility]. Nothing further needed.

## 2018-04-23 DIAGNOSIS — J309 Allergic rhinitis, unspecified: Secondary | ICD-10-CM | POA: Diagnosis not present

## 2018-04-23 DIAGNOSIS — Z6827 Body mass index (BMI) 27.0-27.9, adult: Secondary | ICD-10-CM | POA: Diagnosis not present

## 2018-04-23 DIAGNOSIS — E663 Overweight: Secondary | ICD-10-CM | POA: Diagnosis not present

## 2018-04-23 DIAGNOSIS — I10 Essential (primary) hypertension: Secondary | ICD-10-CM | POA: Diagnosis not present

## 2018-04-23 DIAGNOSIS — M1990 Unspecified osteoarthritis, unspecified site: Secondary | ICD-10-CM | POA: Diagnosis not present

## 2018-04-23 DIAGNOSIS — Z8673 Personal history of transient ischemic attack (TIA), and cerebral infarction without residual deficits: Secondary | ICD-10-CM | POA: Diagnosis not present

## 2018-04-24 DIAGNOSIS — M25561 Pain in right knee: Secondary | ICD-10-CM | POA: Diagnosis not present

## 2018-04-24 DIAGNOSIS — M1990 Unspecified osteoarthritis, unspecified site: Secondary | ICD-10-CM | POA: Diagnosis not present

## 2018-04-24 DIAGNOSIS — M25562 Pain in left knee: Secondary | ICD-10-CM | POA: Diagnosis not present

## 2018-04-24 NOTE — Telephone Encounter (Signed)
Approved on May 1   CaseId:[id number];Status:Approved;Review Type:Prior Auth;Coverage Start Date:03/22/2018;Coverage End Date:04/22/2019;  DrugLORazepam 1MG  OR TABS  [employer] PA Form  Patient and pharmacy contacted per Corrine  Nothing further needed

## 2018-05-01 DIAGNOSIS — Z6827 Body mass index (BMI) 27.0-27.9, adult: Secondary | ICD-10-CM | POA: Diagnosis not present

## 2018-05-01 DIAGNOSIS — M25561 Pain in right knee: Secondary | ICD-10-CM | POA: Diagnosis not present

## 2018-05-13 DIAGNOSIS — M2550 Pain in unspecified joint: Secondary | ICD-10-CM | POA: Diagnosis not present

## 2018-05-13 DIAGNOSIS — R7989 Other specified abnormal findings of blood chemistry: Secondary | ICD-10-CM | POA: Diagnosis not present

## 2018-05-13 DIAGNOSIS — M25569 Pain in unspecified knee: Secondary | ICD-10-CM | POA: Diagnosis not present

## 2018-05-13 DIAGNOSIS — M19072 Primary osteoarthritis, left ankle and foot: Secondary | ICD-10-CM | POA: Diagnosis not present

## 2018-05-13 DIAGNOSIS — M79673 Pain in unspecified foot: Secondary | ICD-10-CM | POA: Diagnosis not present

## 2018-05-13 DIAGNOSIS — M19071 Primary osteoarthritis, right ankle and foot: Secondary | ICD-10-CM | POA: Diagnosis not present

## 2018-05-13 DIAGNOSIS — M79642 Pain in left hand: Secondary | ICD-10-CM | POA: Diagnosis not present

## 2018-05-13 DIAGNOSIS — M1990 Unspecified osteoarthritis, unspecified site: Secondary | ICD-10-CM | POA: Diagnosis not present

## 2018-05-13 DIAGNOSIS — M109 Gout, unspecified: Secondary | ICD-10-CM | POA: Diagnosis not present

## 2018-05-13 DIAGNOSIS — D8989 Other specified disorders involving the immune mechanism, not elsewhere classified: Secondary | ICD-10-CM | POA: Diagnosis not present

## 2018-05-13 DIAGNOSIS — M791 Myalgia, unspecified site: Secondary | ICD-10-CM | POA: Diagnosis not present

## 2018-05-13 DIAGNOSIS — M533 Sacrococcygeal disorders, not elsewhere classified: Secondary | ICD-10-CM | POA: Diagnosis not present

## 2018-05-13 DIAGNOSIS — M79641 Pain in right hand: Secondary | ICD-10-CM | POA: Diagnosis not present

## 2018-05-13 DIAGNOSIS — M549 Dorsalgia, unspecified: Secondary | ICD-10-CM | POA: Diagnosis not present

## 2018-05-13 DIAGNOSIS — M545 Low back pain: Secondary | ICD-10-CM | POA: Diagnosis not present

## 2018-05-13 DIAGNOSIS — M79643 Pain in unspecified hand: Secondary | ICD-10-CM | POA: Diagnosis not present

## 2018-05-14 DIAGNOSIS — E0590 Thyrotoxicosis, unspecified without thyrotoxic crisis or storm: Secondary | ICD-10-CM | POA: Diagnosis not present

## 2018-05-14 DIAGNOSIS — M2500 Hemarthrosis, unspecified joint: Secondary | ICD-10-CM | POA: Diagnosis not present

## 2018-06-01 DIAGNOSIS — R21 Rash and other nonspecific skin eruption: Secondary | ICD-10-CM | POA: Diagnosis not present

## 2018-06-01 DIAGNOSIS — Z79899 Other long term (current) drug therapy: Secondary | ICD-10-CM | POA: Diagnosis not present

## 2018-06-01 DIAGNOSIS — M79673 Pain in unspecified foot: Secondary | ICD-10-CM | POA: Diagnosis not present

## 2018-06-01 DIAGNOSIS — R0989 Other specified symptoms and signs involving the circulatory and respiratory systems: Secondary | ICD-10-CM | POA: Diagnosis not present

## 2018-06-01 DIAGNOSIS — M79643 Pain in unspecified hand: Secondary | ICD-10-CM | POA: Diagnosis not present

## 2018-06-01 DIAGNOSIS — M2550 Pain in unspecified joint: Secondary | ICD-10-CM | POA: Diagnosis not present

## 2018-06-01 DIAGNOSIS — M1990 Unspecified osteoarthritis, unspecified site: Secondary | ICD-10-CM | POA: Diagnosis not present

## 2018-06-01 DIAGNOSIS — M549 Dorsalgia, unspecified: Secondary | ICD-10-CM | POA: Diagnosis not present

## 2018-06-01 DIAGNOSIS — M25569 Pain in unspecified knee: Secondary | ICD-10-CM | POA: Diagnosis not present

## 2018-06-01 DIAGNOSIS — M359 Systemic involvement of connective tissue, unspecified: Secondary | ICD-10-CM | POA: Diagnosis not present

## 2018-06-01 DIAGNOSIS — M109 Gout, unspecified: Secondary | ICD-10-CM | POA: Diagnosis not present

## 2018-06-01 DIAGNOSIS — M791 Myalgia, unspecified site: Secondary | ICD-10-CM | POA: Diagnosis not present

## 2018-06-16 DIAGNOSIS — Z8639 Personal history of other endocrine, nutritional and metabolic disease: Secondary | ICD-10-CM | POA: Diagnosis not present

## 2018-06-16 DIAGNOSIS — E039 Hypothyroidism, unspecified: Secondary | ICD-10-CM | POA: Diagnosis not present

## 2018-07-03 DIAGNOSIS — M79643 Pain in unspecified hand: Secondary | ICD-10-CM | POA: Diagnosis not present

## 2018-07-03 DIAGNOSIS — M791 Myalgia, unspecified site: Secondary | ICD-10-CM | POA: Diagnosis not present

## 2018-07-03 DIAGNOSIS — M359 Systemic involvement of connective tissue, unspecified: Secondary | ICD-10-CM | POA: Diagnosis not present

## 2018-07-03 DIAGNOSIS — R21 Rash and other nonspecific skin eruption: Secondary | ICD-10-CM | POA: Diagnosis not present

## 2018-07-03 DIAGNOSIS — Z79899 Other long term (current) drug therapy: Secondary | ICD-10-CM | POA: Diagnosis not present

## 2018-07-03 DIAGNOSIS — M1990 Unspecified osteoarthritis, unspecified site: Secondary | ICD-10-CM | POA: Diagnosis not present

## 2018-07-03 DIAGNOSIS — M2550 Pain in unspecified joint: Secondary | ICD-10-CM | POA: Diagnosis not present

## 2018-07-03 DIAGNOSIS — M109 Gout, unspecified: Secondary | ICD-10-CM | POA: Diagnosis not present

## 2018-07-03 DIAGNOSIS — M549 Dorsalgia, unspecified: Secondary | ICD-10-CM | POA: Diagnosis not present

## 2018-07-16 DIAGNOSIS — K219 Gastro-esophageal reflux disease without esophagitis: Secondary | ICD-10-CM | POA: Diagnosis not present

## 2018-07-20 DIAGNOSIS — R12 Heartburn: Secondary | ICD-10-CM | POA: Diagnosis not present

## 2018-07-20 DIAGNOSIS — K219 Gastro-esophageal reflux disease without esophagitis: Secondary | ICD-10-CM | POA: Diagnosis not present

## 2018-08-14 DIAGNOSIS — M109 Gout, unspecified: Secondary | ICD-10-CM | POA: Diagnosis not present

## 2018-08-14 DIAGNOSIS — M359 Systemic involvement of connective tissue, unspecified: Secondary | ICD-10-CM | POA: Diagnosis not present

## 2018-08-14 DIAGNOSIS — M791 Myalgia, unspecified site: Secondary | ICD-10-CM | POA: Diagnosis not present

## 2018-08-14 DIAGNOSIS — M79643 Pain in unspecified hand: Secondary | ICD-10-CM | POA: Diagnosis not present

## 2018-08-14 DIAGNOSIS — M549 Dorsalgia, unspecified: Secondary | ICD-10-CM | POA: Diagnosis not present

## 2018-08-14 DIAGNOSIS — R21 Rash and other nonspecific skin eruption: Secondary | ICD-10-CM | POA: Diagnosis not present

## 2018-08-14 DIAGNOSIS — M2550 Pain in unspecified joint: Secondary | ICD-10-CM | POA: Diagnosis not present

## 2018-08-14 DIAGNOSIS — Z79899 Other long term (current) drug therapy: Secondary | ICD-10-CM | POA: Diagnosis not present

## 2018-08-14 DIAGNOSIS — M1990 Unspecified osteoarthritis, unspecified site: Secondary | ICD-10-CM | POA: Diagnosis not present

## 2018-08-17 DIAGNOSIS — M2550 Pain in unspecified joint: Secondary | ICD-10-CM | POA: Diagnosis not present

## 2018-08-26 DIAGNOSIS — R2689 Other abnormalities of gait and mobility: Secondary | ICD-10-CM | POA: Diagnosis not present

## 2018-08-26 DIAGNOSIS — R251 Tremor, unspecified: Secondary | ICD-10-CM | POA: Diagnosis not present

## 2018-08-26 DIAGNOSIS — G20 Parkinson's disease: Secondary | ICD-10-CM | POA: Diagnosis not present

## 2018-09-21 ENCOUNTER — Ambulatory Visit (INDEPENDENT_AMBULATORY_CARE_PROVIDER_SITE_OTHER): Payer: Medicare Other | Admitting: Pulmonary Disease

## 2018-09-21 ENCOUNTER — Ambulatory Visit (INDEPENDENT_AMBULATORY_CARE_PROVIDER_SITE_OTHER): Payer: Medicare Other

## 2018-09-21 ENCOUNTER — Encounter: Payer: Self-pay | Admitting: Pulmonary Disease

## 2018-09-21 VITALS — BP 122/80 | HR 70 | Temp 97.7°F | Ht 70.0 in | Wt 183.4 lb

## 2018-09-21 DIAGNOSIS — Z23 Encounter for immunization: Secondary | ICD-10-CM

## 2018-09-21 DIAGNOSIS — I480 Paroxysmal atrial fibrillation: Secondary | ICD-10-CM | POA: Diagnosis not present

## 2018-09-21 DIAGNOSIS — Z8709 Personal history of other diseases of the respiratory system: Secondary | ICD-10-CM | POA: Diagnosis not present

## 2018-09-21 DIAGNOSIS — R0600 Dyspnea, unspecified: Secondary | ICD-10-CM | POA: Diagnosis not present

## 2018-09-21 DIAGNOSIS — F411 Generalized anxiety disorder: Secondary | ICD-10-CM

## 2018-09-21 DIAGNOSIS — K219 Gastro-esophageal reflux disease without esophagitis: Secondary | ICD-10-CM

## 2018-09-21 MED ORDER — ALBUTEROL SULFATE HFA 108 (90 BASE) MCG/ACT IN AERS: 2.0000 | INHALATION_SPRAY | Freq: Four times a day (QID) | RESPIRATORY_TRACT | 6 refills | Status: DC | PRN

## 2018-09-21 MED ORDER — TIOTROPIUM BROMIDE-OLODATEROL 2.5-2.5 MCG/ACT IN AERS: 2.0000 | INHALATION_SPRAY | Freq: Two times a day (BID) | RESPIRATORY_TRACT | 0 refills | Status: AC

## 2018-09-21 NOTE — Patient Instructions (Addendum)
Today we updated your med list in our EPIC system...    Continue your current medications the same...  We refilled your meds per request...  We gave you the 2019 FLU vaccine today...  We discussed my up-coming retirement & suggested that DrUppin take over the responsibility of filling your Stiolto & ProairHFA rescue inhaler going forward (since you have done so well on these without respiratory exacerbations!  Call [facility] for any issues & we'd be glad to have you seen by one of my young partners...  Cherylee,  It has been my great honor to have been one of your doctors over the last few yrs...    Wishing you good healy & much happiness in the years to come.Marland KitchenMarland Kitchen

## 2018-09-21 NOTE — Progress Notes (Addendum)
Subjective:     Patient ID: Bailey Ashley, female   DOB: 09-Apr-1946, 72 y.o.   MRN: [id number]  HPI 72 y/o WF referred for eval of dyspnea> mostly c/o DOE & she's very sedentary/deconditioned, also notes trouble getting a deep breath or getting enough air "IN"; Spirometry & oxygenation OK; she also has signif reflux x yrs and placed on a strict antireflux regimen  ~  April 04, 2015:  Initial consult by SN>   43 y/o woman referred by Dr[facility]ppin of [facility] for evaluation of dyspnea; she is here w/ her daughter, [name] she reports approx 64yr hx COPD/"asthma" w/ SOB/ DOE, some cough & wheezing, all worse over the last 52mo; she notes difficulty shopping, cleaning house, climbing stairs, "any activity"; says mild cough, no sput or hemoptysis, but notes "wheezing, chest tight";  She states that "I stay cold" she's on blood thinners and got herself a rolling walker "this helps"; on further questioning she notes that it seems hard to get the air "in", can't get a deep breath, not satisfied w/ her breathing & freq sighs; she admits to some anxiety- "I get worked up" she says, but she's been on Alprazolam 0.5mg  Tid & no better by her hx...     Smoking Hx> she started in her teens and quit in her 26s (over 28 yrs ago) but she did smoke up to 2ppd for <20 pack-yr smoking hx...    Reflux> she has hx signif reflux for yrs she says & takes Ranitadine for this...    Current meds>  Symbicort80-2spBid, ?Qvar80-2spBid, NEB w/ Duoneb Tid, Singulair10Qhs...    PMHx includes prev hx PE and cardiac hx (followed by [facility] in [place] but we don't have their records), ?CHF, she says they did ABG, CXR, ONO via [place] pending...    FamHx reveals that her husb died 5 yrs ago w/ severe COPD & pancreatic cancer, he was an Chief Financial Officer in the [employer] for 88yrs spent [place] in the middle [place], then [place] home (very stressful for pt)...  EXAM reveals Afeb, VSS, O2sat=100% on RA;  Heent- neg, mallampati2;   Chest- clear w/o w/r/r;  Cor- RR w/o m/r/g;  Ext- w/o c/c/e...  CXR 03/16/15 in [place] showed heart wnl in size, lungs clear, NAD...   ABG by [facility] 03/16/15 showed pH=7.41, pCO2=39, pO2=67 on RA  Spirometry 04/04/15 showed FVC=3.32 (90%), FEV1=2.46 (88%), %1sec=74, mid-flows were wnl at 97%predicted...  Ambulatory oxygen test> O2sat= 98% on RA at rest w/ pulse=126/min;  She walked 1 lap w/ lowest O2sat=97% w/ pulse=97% (stopped due to pain & "giving out"  LABS from [facility] reviewed> IgE level=9 and all RAST tests NEG;  IMP/PLAN>> we discussed her dyspnea, esp the sensation of "can't get the air in" and lack of objective abnormalities; she is no better on her Alprazolam- therefore suggest switch to LORAZEPAM 1mg Tid;  I feel it would be helpful to establish w/ a good counselor as well, in the meantime try the new Zoloft called in by DrUppin;  She may continue to use her NEBs as needed, and rec starting an exercise program;  In addition we will start PANTOPRAZOLE for her reflux symptoms and an antireflux regimen... Plan ROV 83mo for recheck...  ~  May 04, 2015:  48mo ROV w/ SN>  Bailey Ashley was seen 57mo ago w/ asthma, SOB w/ any activity, wheezing & tight in the chest, anxiety related to the passing of her husb several yrs ago; we adjusted her meds in the  interval to account for her insurance coverage to [employer] 1/d, Qvar80-2spBid, Lorazepam1mg Tid, ProtonixQd, and counseling (which she did not do); she reports much better on these meds- able to get deeper breath, feels more satisfied breathing, etc;  She is now ambulating w/ her 4pt cane & not the walker, doing some housework and in the yard... EXAM remains clear & she is in better spirits...  We discussed ROV in 29mo sooner if needed prn...  ~  September 12, 2015:  72mo ROV w/ SN>  Bailey Ashley returns feeling well & doing satis; she had a summer vacation in [place] visiting family- she did well x for some trouble w/ the pollen and one sudden syncopal spell, seen  by physician in [place] & nothing found, she denies similat prob before or since;  The pollen has recently caused some sneezing, cough w/ yellow mucus, & congestion- we discussed Rx w/ ZPak, Claritin/ Flonase, Mucinex, Delsym... We reviewed the following medical problems during today's office visit >>     Dyspnea- likely multifactorial w/ anxiety, chest wall factors, reflux playing a roll => improved on Rx & reminded about exercise...    Hx Asthma> on Stiolto- one puff daily & ProairHFA prn; doing satis w/o wheezing, tightness, etc...    Hx PE> on Xarelto per [facility] in [place], we do not have their records...    Hx CHF> followed by [facility] in [place], we do not have their records, Echo results, etc; pt not on any other cardiac meds....    Hyperlipid> on Simva40 per PCP in [place]    GERD> on Protonix40 & Ranitadine 300mg bid; we reviewed the need for antireflux regimen (elev HOB, NPO after dinner, etc)...    Hypothyroid> on Synthroid125 & followed by her PCP...    Anxiety/ Depression> on Lorazepam 1mg - 1/2 to 1 tab Tid & Zoloft100- 1/2 tab daily; husb passed away 44yrs ago & very stressful for pt... EXAM reveals Afeb, VSS, O2sat=99% on RA;  Heent- neg, mallampati2;  Chest- clear w/o w/r/r;  Cor- RR w/o m/r/g;  Ext- w/o c/c/e... IMP/PLAN>>  We wrote for a ZPak 7 some refills for prn use; she will continue w/ the Stiolto, Lorazepam, and Zoloft; we reviewed the need for a progressive exercise program; plan ROV in 58mo, sooner if needed prn...  ~  March 11, 2016:  69mo ROV & Bailey Ashley indicates that she is doing satis just noting some recent throat clearing, sl cough w/o sput, no CP x w/ coughing & denies much SOB x w/ exertion;  She does her ADLs ok, does her shopping ok (sometimes takes the motorized cart), and ambulates w/ her cane;  She needs incr exercise program but she complains that insurance won't pay for silver sneakers- I told her she needs to cover the cost of exercise program on her own, it's THAT  important (states the Y is too expensive "it's $40 per month!")... She remains on Stiolto daily, Singulair10, Flonase, and AlbutHFA prn; she has the Ativan1mg  as well...     EXAM reveals Afeb, VSS, O2sat=98% on RA;  Heent- neg, mallampati2;  Chest- clear w/o w/r/r;  Cor- RR w/o m/r/g;  Abd- soft, nontender, neg;  Ext- w/o c/c/e;  Neuro- gait abn, otherw nonfocal...  CXR 03/11/16>  Norm heart size, tiny RUL nodule , biapical scarring, and mild atx in right mid lung zone; osteopenia in spine => I called pt & rec CT Chest for further eval...  IMP/PLAN>>  She appears stable but I believe could be better  if she increased her exercise program- clearly needs some supervision as in a silver sneaker program or joining the Y etc... CXR today shows some chr changes & ?tiny nodule in RUL area- suggest CT Chest for further eval... Rec to continue her Stiolto daily + the Singulair, Flonase, & the AlbutHFA rescue inhaler prn... She can use cough drops, throat lozenges, Delsym & Tramadol as needed for the cough;  We wrote for some Hycodan for prn use at night...  ADDENDUM>  CT Chest 03/14/16 showed some atherosclerotic dis in LAD, calcif/granulomatous left suprahilar node, mild reticulonodular opac in lung apicies w/ nodular component ~93mm (corresponds to CXR) & likely represents scar tissue, left renal cyst, & benign splenic lymphangioma; Radiology felt that f/u imaging not nec...   ~  September 04, 2016:  37mo ROV & pulmonary follow up visit> Bailey Ashley notes a sl dry cough 7 wonders if one of her AFib meds might be making her cough (she is NOT on an ACE or ARB);  She continues on Stiolto-2sp/d, Singulair10, AlbutHFA rescue prn (hasn't needed), and Hycodan prn;  She also has Loazepam 1mg -1/2 to 1 tab tid prn, along w/ Zoloft & Trazadone... Her PCP is DrUppin in [place] her Cardiology team is there too- unfortunately we do not have any notes from these providers in epic...  She reports that was was going to a local gym but  stopped when she had the onset of AFib; now cards has released her to return to exercise program & she will start back in the [employer] program...    Dyspnea- likely multifactorial w/ anxiety, chest wall factors, reflux playing a roll => improved on Rx w/ Lorazepam, Stiolto, Singulair, Mucinex, etc...    Hx Asthma> on Stiolto- one puff daily & ProairHFA prn; doing satis w/o wheezing, tightness, etc...    Hx PE> on Xarelto per [facility] in [place], we do not have their records...    New onset AFib, Hx CHF> followed by [facility] in [place], we do not have their records, Echo results, etc; currently taking ?Flecainide, Xarelto, [id number] & asked to get notes for [place] to scan into Epic.    Hyperlipid> on Simva40 per PCP in [place]    GERD> on Protonix40 & Ranitadine 300mg bid; we reviewed the need for antireflux regimen (elev HOB, NPO after dinner, etc)...    Hypothyroid> on Synthroid125 & followed by her PCP...    Anxiety/ Depression> on Lorazepam 1mg - 1/2 to 1 tab Tid & Zoloft100- 1/2 tab daily; husb passed away 50yrs ago & very stressful for pt... EXAM reveals Afeb, VSS, O2sat=100% on RA;  Heent- neg, mallampati2;  Chest- clear w/o w/r/r;  Cor- RR w/o m/r/g;  Ext- w/o c/c/e... IMP/PLAN>>  We refilled Lorazepam, and rec continuing Stiolto, Singulair, AlbutHFA, Mucinex, fluids, and Delsym... Call for any problems.  ~  March 18, 2017:  56mo ROV & pulm follow up visit>  Bailey Ashley reports having a URI 59mo ago, seen by her PCP & treated w/ ZPak=> improved but c/o some cough, discolored sput, but denies f/c/s, etc... Overall she says "pretty good" We reviewed the following medical problems during today's office visit >>     Dyspnea- likely multifactorial w/ asthma, anxiety, chest wall factors, reflux playing a roll => improved on Rx w/ Lorazepam, Stiolto, Singulair, Mucinex, etc...    Hx allergic rhinitis> on Singulair10, Flonase, and has been treated for sinusitis...    Hx Asthma> on Stiolto- one puff daily &  ProairHFA prn; doing satis w/o wheezing, tightness,  etc...    Hx PE> on Xarelto per [facility] in [place], we do not have their records...    AFib, Hx CHF> followed by [facility] in [place], we do not have their records, Echo results, etc; currently taking ?Flecainide, Xarelto, [id number] & asked to get notes for [place] to scan into Epic (none yet); she is holding NSR- pt reports "everything is fine" and CP "resolved on it's own"... She needs a regular exercise program...    Hyperlipid> on Simva40 per PCP- DrUppin in [place]    GERD> on Protonix40 & Ranitadine 300mg bid; we reviewed the need for antireflux regimen (elev HOB, NPO after dinner, etc)...    Hypothyroid> on Synthroid125 & followed by her PCP...    Anxiety/ Depression> on Lorazepam 1mg - 1/2 to 1 tab Tid & Zoloft100- 1/2 tab daily; husb passed away 35yrs ago & very stressful for pt... EXAM reveals Afeb, VSS, O2sat=98% on RA;  Heent- neg, mallampati2;  Chest- clear w/o w/r/r;  Cor- RR w/o m/r/g;  Ext- w/o c/c/e...  CXR 03/18/17 (independently reviewed by me in the PACS system) shows norm heart size, clear lungs, prev reported nodular changes RUL is less well apprec, no focal infiltrates- NAD... IMP/PLAN>>  Bailey Ashley is stable, notes occas URIs, improved w/ ZPak;  CXR is clear- NAD;  Rec to continue Stiolto regularly- needs improved exercise program etc; she is asked to get records from [place] for [place] to scan into Epic...  ~  September 18, 2017:  101mo ROV & Bailey Ashley notes mild cough, green sput production for several days and incr SOB/DOE which is actually pretty stable over the last yr or more; she denies f/c/s, wt stable, and otherw about the same...    She has multifactorial dyspnea- improved & stable on prn Lorazepam & reminded that she can take it more regularly if needed;  Her asthma is controlled on Stiolto one inhalation daily ([facility]);      She saw [facility]- DrFolk in [place] 08/14/17> f/u HBP & AFib on Flecainide100Bid, and Xarelto20;  they placed an event monitor & so far it has demonstated bouts of AFib w/ rvr=> added CartiaXT120...    EXAM reveals Afeb, VSS, O2sat=99% on RA;  Heent- neg, mallampati2;  Chest- clear w/o w/r/r;  Cor- RR w/o m/r/g;  Ext- w/o c/c/e;  Neuro- intact w/o focal deficits... IMP/PLAN>>  Bailey Ashley has a mild bronchitic exac & she has reponded well to Zithromax in the past=> REC ZPak and continue Stiolto daily (NOTE- she could alsways incr to 2 inhalations/d if needed); use OTC Mucinex and AlbutHFA rescue inhaler as needed; OK 2018 flu vaccine today...  ~  March 18, 2018:  41mo ROV & Bailey Ashley reports breathing well w/o new complaints or concerns; she remains on Stiolto- one puff daily, Singulair10, Flonase Qhs and AlbutHFA rescue inhaler as needed (ave 1-2 per month);  She denies cough, sput, hemptysis, notes her SOB is improved overall & she's been walking w/ a 4-pt cane at home & using an exercise bike...     Her PCP is DrUppin in [place] she tells me she was last seen ~35mo ago & no changes in her meds...    Her Cardiologist is also in [place] she reports being seen ~11/2017, holding NSR, wore a monitor, same meds- no changes made...     She also saw NEURO at [facility] & was started on Sinemet for Parkinson's dis... We reviewed the following medical problems during today's office visit>  Dyspnea- likely multifactorial w/ asthma, anxiety, chest wall factors, reflux playing a roll => improved on Rx w/ Lorazepam, Stiolto, Singulair, Mucinex, etc...    Hx allergic rhinitis> on Singulair10, Flonase, and has been treated for sinusitis...    Hx Asthma> on Stiolto- one puff daily & ProairHFA prn; doing satis w/o wheezing, tightness, etc...    Hx PE> on Xarelto per [facility] in [place], we do not have their records...    AFib, Hx CHF> followed by [facility] in [place], we do not have their records, Echo results, etc; currently taking ?Flecainide, Xarelto, [id number] & asked to get notes for [place] to scan into Epic (none  yet); she is holding NSR- pt reports "everything is fine" and CP "resolved on it's own"... She needs a regular exercise program...    Hyperlipid> on Simva40 per PCP- DrUppin in [place]    Hypothyroid> on Synthroid125 & followed by her PCP...    GERD> on Protonix40 & Ranitadine 300mg bid; we reviewed the need for antireflux regimen (elev HOB, NPO after dinner, etc)...    Parkinson's disease>  She reports recently started on Sinemet & they are following...    Anxiety/ Depression> on Lorazepam 1mg - 1/2 to 1 tab Tid & Zoloft100- 1/2 tab daily; husb passed away 15yrs ago & very stressful for pt... EXAM reveals Afeb, VSS, O2sat=97% on RA;  Heent- neg, mallampati2;  Chest- clear w/o w/r/r;  Cor- RR w/o m/r/g;  Ext- w/o c/c/e;  Neuro- intact w/o focal deficits... IMP/PLAN>>  Bailey Ashley remains stable on her Stiolto, Singulair, Flonase, AlbutHFA;  Asked to continue same and she requests rov in 33mo...   ~  September 21, 2018:  80mo ROV & pulmonary follow up visit>  Bailey Ashley again reports feeling well overall, breathing is good on her meds:  Stiolto- one puff daily, Singulair10, Flonase Qhs and AlbutHFA rescue inhaler as needed (ave 1-2 per month);  She notes min cough, small amt clear sput, hemptysis, notes her SOB is improved overall (able to shop etc) & she's been walking w/ a 4-pt cane at home & using an exercise bike (on occasion)... She reports that she's been diagnosed w/ a mixed connective tissue dis per Rheum & started on MTX 2.5mg  taking 6/wk, and Pred 5mg /d (she doesn't recall the Rheumatologists name & there are no notes avail in Epic to review); she reports joints aching and stiff, some swelling noted... (NOTE: this is rather irregular that this 72 y/o woman has been diagnosed w/ both Parkinson's AND a MCT disease within the last yr)...  We reviewed the following medical problems during today's office visit>      Dyspnea- likely multifactorial w/ asthma, anxiety, chest wall factors, reflux playing a roll =>  improved on Rx w/ Stiolto, Singulair, Mucinex, Protonix/Ranitadine, and Lorazepam...    Hx allergic rhinitis> on Singulair10, Flonase, and has been treated for sinusitis...    Hx Asthma> on Stiolto- one puff daily & ProairHFA prn; doing satis w/o wheezing, tightness, etc...    Hx PE> on Xarelto per [facility] in [place], we do not have their records...    AFib, Hx CHF> followed by [facility] in [place], we do not have their records, Echo results, etc; currently taking ?Flecainide, Xarelto, [id number] & asked to get notes for [place] to scan into Epic (none yet); she is holding NSR- pt reports "everything is fine" and CP "resolved on it's own"... She needs a regular exercise program...    Hyperlipid> on Simva40 per PCP- DrUppin in [place] follows her labs.Marland KitchenMarland Kitchen  Hypothyroid> on Synthroid125 & this is also followed by her PCP...    GERD> on Protonix40 & Ranitadine 300mg bid; we reviewed the need for antireflux regimen (elev HOB, NPO after dinner, etc)...    RHEUM- ?MCT disease>  She reports being evaluated by Rheum & started on MTX 2.5mg - 6 tabs per wk and Pred 5mg /d (we do not have records to review & she can't recall doctor's name)...     Parkinson's disease>  She reports started on Sinemet early in 2019 & Neuro is following...    Anxiety/ Depression> on Lorazepam 1mg - 1/2 to 1 tab Tid & Zoloft100- 1/2 tab daily; husb passed away 67yrs ago & very stressful for pt... EXAM reveals Afeb, VSS, O2sat=96% on RA;  Heent- neg, mallampati2;  Chest- few basilar rhonchi otherw clear w/o w/r;  Cor- RR w/o m/r/g;  Ext- w/o c/c/e;  Neuro- sl tremor noted, otherw intact w/o focal deficits... IMP/PLAN>>  Dietra is stable from the pulmonary perspective & doing well on her inhalers etc;  We gave her the 2019 FLU vaccine today & refilled the inhalers;  She has mounting medical issues w/ Dx of Parkinson's and a mixed connective tissue disease requiring Sinemet & MTX/ Pred- followed by these specialists;  We discussed my up coming  retirement & suggested that we request DrUppin in [place] writing her inhalers etc and consider re-referral the [facility] if she develops additional breathing problems in the future...     Past Medical History  Diagnosis Date  . Asthma & DYSPNEA >> on Stiolto 2sp once daily, Singulair10, AlbutHFA prn    -- Note: Lorazepam 1mg - taking 1/2-1 tab Tid also helps her dyspnea...   . GERD (gastroesophageal reflux disease) >> on Protonix40 & Ranitadine 300 Bid   . Hyperlipidemia >> on Simva40 Qhs   . Hypothyroid >> on Levothy125   . Osteoporosis   . Depression >> on ZOLOFT100, Desyrel-150Qhs   . Psoriasis    -- Dx w/ mixed connective tissue disease in summer 2019 by Rheum   & started on MTX & Pred   . Ulnar neuropathy    -- Dx w/ Parkinson's disease in early 2019 by Neuro & started on Sinemet   . Constipation   . Hypertension >. Prev on Atenolol25- off now    . Bilateral hip pain >> on Tramadol50 prn   . Edema   . Tachycardia/Arrhythmia >> on Tambocor 100Bid + CardizemCD120/d   . Congestive heart failure >> on ASA81; prev on Lasix40- 2daily & K20Bid- off now   . History of smoking   . Grief reaction >> on Ativan 1mg  tid prn   . Gout >> on Allopurinol100   . Pulmonary embolism >> on Xarelto     Past Surgical History:  Procedure Laterality Date  . Cataract surgery    . NASAL SINUS SURGERY      Outpatient Encounter Medications as of 09/21/2018  Medication Sig  . albuterol (PROVENTIL HFA;VENTOLIN HFA) 108 (90 Base) MCG/ACT inhaler Inhale 2 puffs into the lungs every 6 (six) hours as needed for wheezing or shortness of breath.  . allopurinol (ZYLOPRIM) 100 MG tablet   . aspirin 81 MG chewable tablet Chew 81 mg by mouth daily.  . baclofen (LIORESAL) 10 MG tablet Take 10 mg by mouth 2 (two) times daily as needed for muscle spasms.  . carbidopa-levodopa (SINEMET IR) 25-100 MG tablet Take 1 tablet by mouth 4 (four) times daily.  Marland Kitchen diltiazem (CARDIZEM CD) 120 MG 24 hr capsule  Take  120 mg by mouth daily.  . flecainide (TAMBOCOR) 100 MG tablet Take 100 mg by mouth 2 (two) times daily.  . fluticasone (FLONASE) 50 MCG/ACT nasal spray   . folic acid (FOLVITE) 1 MG tablet Take 1 mg by mouth daily.  Marland Kitchen levothyroxine (SYNTHROID, LEVOTHROID) 150 MCG tablet 150 mcg daily.   Marland Kitchen LORazepam (ATIVAN) 1 MG tablet Take 0.5-1 tablets (0.5-1 mg total) by mouth 3 (three) times daily as needed for anxiety.  . meclizine (ANTIVERT) 12.5 MG tablet   . methotrexate (RHEUMATREX) 2.5 MG tablet Take 2.5 mg by mouth once a week. Caution:Chemotherapy. Protect from light.  . montelukast (SINGULAIR) 10 MG tablet Take 10 mg by mouth.  . Multiple Vitamins-Minerals (MULTIVITAMIN WITH MINERALS) tablet Take 1 tablet by mouth daily.  . pantoprazole (PROTONIX) 40 MG tablet 40 mg daily. 2 tabs daily  . predniSONE (DELTASONE) 5 MG tablet Take 5 mg by mouth daily with breakfast.  . ranitidine (ZANTAC) 300 MG capsule Take 300 mg by mouth.  . rivaroxaban (XARELTO) 20 MG TABS tablet Take 20 mg by mouth daily with supper.  . sertraline (ZOLOFT) 100 MG tablet Take 50 mg by mouth daily.  . simvastatin (ZOCOR) 40 MG tablet Take 40 mg by mouth.  . Tiotropium Bromide-Olodaterol (STIOLTO RESPIMAT) 2.5-2.5 MCG/ACT AERS Inhale 1 puff into the lungs daily.  . traMADol (ULTRAM) 50 MG tablet Take 50 mg by mouth at bedtime as needed.  . traZODone (DESYREL) 150 MG tablet 150 mg at bedtime.  . [DISCONTINUED] albuterol (PROVENTIL HFA;VENTOLIN HFA) 108 (90 BASE) MCG/ACT inhaler Inhale 2 puffs into the lungs every 6 (six) hours as needed for wheezing or shortness of breath.  . Tiotropium Bromide-Olodaterol (STIOLTO RESPIMAT) 2.5-2.5 MCG/ACT AERS Inhale 2 puffs into the lungs 2 (two) times daily.   No facility-administered encounter medications on file as of 09/21/2018.     Allergies  Allergen Reactions  . Contrast Media [Iodinated Diagnostic Agents] Shortness Of Breath  . Sulfa Antibiotics Hives    Immunization History   Administered Date(s) Administered  . Influenza, High Dose Seasonal PF 09/18/2017, 09/21/2018  . Influenza,inj,Quad PF,6+ Mos 09/23/2015, 09/04/2016  . Influenza-Unspecified 11/06/2014  . Pneumococcal-Unspecified 11/06/2012    Current Medications, Allergies, Past Medical History, Past Surgical History, Family History, and Social History were reviewed in [employer] record.   Review of Systems          All symptoms NEG except where BOLDED >>  Constitutional:  F/C/S, fatigue, anorexia, unexpected weight change. HEENT:  HA, visual changes, hearing loss, earache, nasal symptoms, sore throat, mouth sores, hoarseness. Resp:  cough, sputum, hemoptysis; SOB, tightness, wheezing. Cardio:  CP, palpit, DOE, orthopnea, edema. GI:  N/V/D/C, blood in stool; reflux, abd pain, distention, gas. GU:  dysuria, freq, urgency, hematuria, flank pain, voiding difficulty. MS:  joint pain, swelling, tenderness, decr ROM; neck pain, back pain, etc. Neuro:  HA, tremors, seizures, dizziness, syncope, weakness, numbness, gait abn. Skin:  suspicious lesions or skin rash. Heme:  adenopathy, bruising, bleeding. Psyche:  confusion, agitation, sleep disturbance, hallucinations, anxiety, depression suicidal.   Objective:   Physical Exam    Vital Signs:  Reviewed...   General:  WD, WN, 72 y/o WF in NAD; alert & oriented; pleasant & cooperative... HEENT:  [place]/AT; Conjunctiva- pink, Sclera- nonicteric, EOM-wnl, PERRLA, EACs-clear, TMs-wnl; NOSE-clear; THROAT-clear & wnl.  Neck:  Supple w/ fair ROM; no JVD; normal carotid impulses w/o bruits; no thyromegaly or nodules palpated; no lymphadenopathy.  Chest:  Clear to P & A;  without wheezes, rales, or rhonchi heard. Heart:  Regular Rhythm; norm S1 & S2 without murmurs, rubs, or gallops detected. Abdomen:  Soft & nontender- no guarding or rebound; normal bowel sounds; no organomegaly or masses palpated. Ext:  Normal ROM; without deformities,  +arthritic changes; no varicose veins,+ venous insuffic, tr edema;  Pulses intact w/o bruits. Neuro:  CNs II-XII intact; motor testing normal; sensory testing normal; gait normal & balance OK. Derm:  No lesions noted; no rash etc. Lymph:  No cervical, supraclavicular, axillary, or inguinal adenopathy palpated.   Assessment:      IMP >>    Dyspnea- likely multifactorial w/ anxiety, chest wall factors, reflux playing a roll...     Hx AR, sinusitis    Hx Asthma    ?tiny RUL nodule on CXR 02/2016=> CXR improved 02/2017...    Hx CHF    GERD    Anxiety/ Depression    Other medical issues as noted...  PLAN >>  05/04/15> She is much improved on Stiolto 1/d, Qvar80-2spBid, Protonix40, and Lorazepam1mg Tid... Rec to continue same for now and we will plan to slowly wean over time... 09/12/15> We wrote for a ZPak & some refills for prn use; she will continue w/ the Stiolto, Lorazepam, and Zoloft; we reviewed the need for a progressive exercise program...  03/11/16> Appears stable on Stiolto, Singulair, Flonase, AlbutHFA but she is still not exercising (to help her DOE) & c/o sl cough & reflux; CXR here shows ?tiny RUL nodule & we will check CT; needs better exercise program and antireflux regimen... 09/04/16>   We refilled Lorazepam, and rec continuing Stiolto, Singulair, AlbutHFA, Mucinex, fluids, and Delsym... Call for any problems 03/18/17>   Bailey Ashley is stable, notes occas URIs, improved w/ ZPak;  CXR is clear- NAD;  Rec to continue Stiolto regularly- needs improved exercise program etc; she is asked to get records from [facility] for [place] to scan into Epic. 09/18/17>   Bailey Ashley has a mild bronchitic exac & she has reponded well to Zithromax in the past=> REC ZPak and continue Stiolto daily (NOTE- she could alsways incr to 2 inhalations/d if needed); use OTC Mucinex and AlbutHFA rescue inhaler as needed; OK 2018 flu vaccine today... 3/37/19>  Bailey Ashley remains stable on her Stiolto, Singulair, Flonase, AlbutHFA;   Asked to continue same and she requests rov in 29mo. 09/21/18>   Bailey Ashley is stable from the pulmonary perspective & doing well on her inhalers etc;  We gave her the 2019 FLU vaccine today & refilled the inhalers;  She has mounting medical issues w/ Dx of Parkinson's and a mixed connective tissue disease requiring Sinemet & MTX/ Pred- followed by these specialists;  We discussed my up coming retirement & suggested that we request DrUppin in [place] writing her inhalers etc and consider re-referral the [facility] if she develops additional breathing problems in the future   Plan:     Patient's Medications  New Prescriptions   TIOTROPIUM BROMIDE-OLODATEROL (STIOLTO RESPIMAT) 2.5-2.5 MCG/ACT AERS    Inhale 2 puffs into the lungs 2 (two) times daily.  Previous Medications   ALLOPURINOL (ZYLOPRIM) 100 MG TABLET       ASPIRIN 81 MG CHEWABLE TABLET    Chew 81 mg by mouth daily.   BACLOFEN (LIORESAL) 10 MG TABLET    Take 10 mg by mouth 2 (two) times daily as needed for muscle spasms.   CARBIDOPA-LEVODOPA (SINEMET IR) 25-100 MG TABLET    Take 1 tablet by mouth 4 (four) times daily.  DILTIAZEM (CARDIZEM CD) 120 MG 24 HR CAPSULE    Take 120 mg by mouth daily.   FLECAINIDE (TAMBOCOR) 100 MG TABLET    Take 100 mg by mouth 2 (two) times daily.   FLUTICASONE (FLONASE) 50 MCG/ACT NASAL SPRAY       FOLIC ACID (FOLVITE) 1 MG TABLET    Take 1 mg by mouth daily.   LEVOTHYROXINE (SYNTHROID, LEVOTHROID) 150 MCG TABLET    150 mcg daily.    LORAZEPAM (ATIVAN) 1 MG TABLET    Take 0.5-1 tablets (0.5-1 mg total) by mouth 3 (three) times daily as needed for anxiety.   MECLIZINE (ANTIVERT) 12.5 MG TABLET       METHOTREXATE (RHEUMATREX) 2.5 MG TABLET    Take 2.5 mg by mouth once a week. Caution:Chemotherapy. Protect from light.   MONTELUKAST (SINGULAIR) 10 MG TABLET    Take 10 mg by mouth.   MULTIPLE VITAMINS-MINERALS (MULTIVITAMIN WITH MINERALS) TABLET    Take 1 tablet by mouth daily.   PANTOPRAZOLE  (PROTONIX) 40 MG TABLET    40 mg daily. 2 tabs daily   PREDNISONE (DELTASONE) 5 MG TABLET    Take 5 mg by mouth daily with breakfast.   RANITIDINE (ZANTAC) 300 MG CAPSULE    Take 300 mg by mouth.   RIVAROXABAN (XARELTO) 20 MG TABS TABLET    Take 20 mg by mouth daily with supper.   SERTRALINE (ZOLOFT) 100 MG TABLET    Take 50 mg by mouth daily.   SIMVASTATIN (ZOCOR) 40 MG TABLET    Take 40 mg by mouth.   TIOTROPIUM BROMIDE-OLODATEROL (STIOLTO RESPIMAT) 2.5-2.5 MCG/ACT AERS    Inhale 1 puff into the lungs daily.   TRAMADOL (ULTRAM) 50 MG TABLET    Take 50 mg by mouth at bedtime as needed.   TRAZODONE (DESYREL) 150 MG TABLET    150 mg at bedtime.  Modified Medications   Modified Medication Previous Medication   ALBUTEROL (PROVENTIL HFA;VENTOLIN HFA) 108 (90 BASE) MCG/ACT INHALER albuterol (PROVENTIL HFA;VENTOLIN HFA) 108 (90 BASE) MCG/ACT inhaler      Inhale 2 puffs into the lungs every 6 (six) hours as needed for wheezing or shortness of breath.    Inhale 2 puffs into the lungs every 6 (six) hours as needed for wheezing or shortness of breath.  Discontinued Medications   No medications on file

## 2018-09-23 DIAGNOSIS — R040 Epistaxis: Secondary | ICD-10-CM | POA: Diagnosis not present

## 2018-09-23 DIAGNOSIS — Z6827 Body mass index (BMI) 27.0-27.9, adult: Secondary | ICD-10-CM | POA: Diagnosis not present

## 2018-09-23 DIAGNOSIS — E663 Overweight: Secondary | ICD-10-CM | POA: Diagnosis not present

## 2018-09-23 DIAGNOSIS — R51 Headache: Secondary | ICD-10-CM | POA: Diagnosis not present

## 2018-09-23 DIAGNOSIS — J9811 Atelectasis: Secondary | ICD-10-CM | POA: Diagnosis not present

## 2018-10-01 DIAGNOSIS — J309 Allergic rhinitis, unspecified: Secondary | ICD-10-CM | POA: Diagnosis not present

## 2018-10-01 DIAGNOSIS — Z6827 Body mass index (BMI) 27.0-27.9, adult: Secondary | ICD-10-CM | POA: Diagnosis not present

## 2018-10-01 DIAGNOSIS — Z09 Encounter for follow-up examination after completed treatment for conditions other than malignant neoplasm: Secondary | ICD-10-CM | POA: Diagnosis not present

## 2018-10-01 DIAGNOSIS — G20 Parkinson's disease: Secondary | ICD-10-CM | POA: Diagnosis not present

## 2018-10-06 DIAGNOSIS — Z7901 Long term (current) use of anticoagulants: Secondary | ICD-10-CM | POA: Diagnosis not present

## 2018-10-06 DIAGNOSIS — Z8673 Personal history of transient ischemic attack (TIA), and cerebral infarction without residual deficits: Secondary | ICD-10-CM | POA: Diagnosis not present

## 2018-10-06 DIAGNOSIS — I10 Essential (primary) hypertension: Secondary | ICD-10-CM | POA: Diagnosis not present

## 2018-10-06 DIAGNOSIS — I480 Paroxysmal atrial fibrillation: Secondary | ICD-10-CM | POA: Diagnosis not present

## 2018-10-14 DIAGNOSIS — M1990 Unspecified osteoarthritis, unspecified site: Secondary | ICD-10-CM | POA: Diagnosis not present

## 2018-10-14 DIAGNOSIS — M109 Gout, unspecified: Secondary | ICD-10-CM | POA: Diagnosis not present

## 2018-10-14 DIAGNOSIS — M0609 Rheumatoid arthritis without rheumatoid factor, multiple sites: Secondary | ICD-10-CM | POA: Diagnosis not present

## 2018-10-14 DIAGNOSIS — Z13820 Encounter for screening for osteoporosis: Secondary | ICD-10-CM | POA: Diagnosis not present

## 2018-10-14 DIAGNOSIS — Z79899 Other long term (current) drug therapy: Secondary | ICD-10-CM | POA: Diagnosis not present

## 2018-10-14 DIAGNOSIS — R21 Rash and other nonspecific skin eruption: Secondary | ICD-10-CM | POA: Diagnosis not present

## 2018-10-14 DIAGNOSIS — M549 Dorsalgia, unspecified: Secondary | ICD-10-CM | POA: Diagnosis not present

## 2018-10-30 DIAGNOSIS — Z1339 Encounter for screening examination for other mental health and behavioral disorders: Secondary | ICD-10-CM | POA: Diagnosis not present

## 2018-10-30 DIAGNOSIS — Z1331 Encounter for screening for depression: Secondary | ICD-10-CM | POA: Diagnosis not present

## 2018-10-30 DIAGNOSIS — E875 Hyperkalemia: Secondary | ICD-10-CM | POA: Diagnosis not present

## 2018-10-30 DIAGNOSIS — Z0000 Encounter for general adult medical examination without abnormal findings: Secondary | ICD-10-CM | POA: Diagnosis not present

## 2018-10-30 DIAGNOSIS — E785 Hyperlipidemia, unspecified: Secondary | ICD-10-CM | POA: Diagnosis not present

## 2018-10-30 DIAGNOSIS — Z9181 History of falling: Secondary | ICD-10-CM | POA: Diagnosis not present

## 2018-11-04 DIAGNOSIS — M1990 Unspecified osteoarthritis, unspecified site: Secondary | ICD-10-CM | POA: Diagnosis not present

## 2018-11-04 DIAGNOSIS — M549 Dorsalgia, unspecified: Secondary | ICD-10-CM | POA: Diagnosis not present

## 2018-11-04 DIAGNOSIS — Z79899 Other long term (current) drug therapy: Secondary | ICD-10-CM | POA: Diagnosis not present

## 2018-11-04 DIAGNOSIS — M0609 Rheumatoid arthritis without rheumatoid factor, multiple sites: Secondary | ICD-10-CM | POA: Diagnosis not present

## 2018-11-04 DIAGNOSIS — Z13820 Encounter for screening for osteoporosis: Secondary | ICD-10-CM | POA: Diagnosis not present

## 2018-11-04 DIAGNOSIS — M109 Gout, unspecified: Secondary | ICD-10-CM | POA: Diagnosis not present

## 2018-11-27 DIAGNOSIS — M0609 Rheumatoid arthritis without rheumatoid factor, multiple sites: Secondary | ICD-10-CM | POA: Diagnosis not present

## 2018-12-11 DIAGNOSIS — M0609 Rheumatoid arthritis without rheumatoid factor, multiple sites: Secondary | ICD-10-CM | POA: Diagnosis not present

## 2018-12-25 DIAGNOSIS — M0609 Rheumatoid arthritis without rheumatoid factor, multiple sites: Secondary | ICD-10-CM | POA: Diagnosis not present

## 2019-01-20 DIAGNOSIS — M0609 Rheumatoid arthritis without rheumatoid factor, multiple sites: Secondary | ICD-10-CM | POA: Diagnosis not present

## 2019-01-20 DIAGNOSIS — M25473 Effusion, unspecified ankle: Secondary | ICD-10-CM | POA: Diagnosis not present

## 2019-01-20 DIAGNOSIS — M1990 Unspecified osteoarthritis, unspecified site: Secondary | ICD-10-CM | POA: Diagnosis not present

## 2019-01-20 DIAGNOSIS — M549 Dorsalgia, unspecified: Secondary | ICD-10-CM | POA: Diagnosis not present

## 2019-01-20 DIAGNOSIS — M25579 Pain in unspecified ankle and joints of unspecified foot: Secondary | ICD-10-CM | POA: Diagnosis not present

## 2019-01-20 DIAGNOSIS — M79643 Pain in unspecified hand: Secondary | ICD-10-CM | POA: Diagnosis not present

## 2019-01-20 DIAGNOSIS — M109 Gout, unspecified: Secondary | ICD-10-CM | POA: Diagnosis not present

## 2019-01-20 DIAGNOSIS — Z79899 Other long term (current) drug therapy: Secondary | ICD-10-CM | POA: Diagnosis not present

## 2019-01-20 DIAGNOSIS — Z13820 Encounter for screening for osteoporosis: Secondary | ICD-10-CM | POA: Diagnosis not present

## 2019-01-22 DIAGNOSIS — J019 Acute sinusitis, unspecified: Secondary | ICD-10-CM | POA: Diagnosis not present

## 2019-01-22 DIAGNOSIS — Z6827 Body mass index (BMI) 27.0-27.9, adult: Secondary | ICD-10-CM | POA: Diagnosis not present

## 2019-01-28 DIAGNOSIS — M0609 Rheumatoid arthritis without rheumatoid factor, multiple sites: Secondary | ICD-10-CM | POA: Diagnosis not present

## 2019-02-09 DIAGNOSIS — J449 Chronic obstructive pulmonary disease, unspecified: Secondary | ICD-10-CM | POA: Diagnosis not present

## 2019-02-09 DIAGNOSIS — J019 Acute sinusitis, unspecified: Secondary | ICD-10-CM | POA: Diagnosis not present

## 2019-02-09 DIAGNOSIS — J209 Acute bronchitis, unspecified: Secondary | ICD-10-CM | POA: Diagnosis not present

## 2019-02-09 DIAGNOSIS — Z6827 Body mass index (BMI) 27.0-27.9, adult: Secondary | ICD-10-CM | POA: Diagnosis not present

## 2019-02-09 DIAGNOSIS — G20 Parkinson's disease: Secondary | ICD-10-CM | POA: Diagnosis not present

## 2019-02-17 DIAGNOSIS — I480 Paroxysmal atrial fibrillation: Secondary | ICD-10-CM | POA: Diagnosis not present

## 2019-02-17 DIAGNOSIS — Z86711 Personal history of pulmonary embolism: Secondary | ICD-10-CM | POA: Diagnosis not present

## 2019-02-17 DIAGNOSIS — Z8673 Personal history of transient ischemic attack (TIA), and cerebral infarction without residual deficits: Secondary | ICD-10-CM | POA: Diagnosis not present

## 2019-02-17 DIAGNOSIS — K219 Gastro-esophageal reflux disease without esophagitis: Secondary | ICD-10-CM | POA: Diagnosis not present

## 2019-02-17 DIAGNOSIS — Z7901 Long term (current) use of anticoagulants: Secondary | ICD-10-CM | POA: Diagnosis not present

## 2019-02-17 DIAGNOSIS — G20 Parkinson's disease: Secondary | ICD-10-CM | POA: Diagnosis not present

## 2019-02-17 DIAGNOSIS — Z8709 Personal history of other diseases of the respiratory system: Secondary | ICD-10-CM | POA: Diagnosis not present

## 2019-02-17 DIAGNOSIS — E02 Subclinical iodine-deficiency hypothyroidism: Secondary | ICD-10-CM | POA: Diagnosis not present

## 2019-02-17 DIAGNOSIS — I10 Essential (primary) hypertension: Secondary | ICD-10-CM | POA: Diagnosis not present

## 2019-02-23 DIAGNOSIS — I10 Essential (primary) hypertension: Secondary | ICD-10-CM | POA: Diagnosis not present

## 2019-02-23 DIAGNOSIS — I480 Paroxysmal atrial fibrillation: Secondary | ICD-10-CM | POA: Diagnosis not present

## 2019-02-24 DIAGNOSIS — R251 Tremor, unspecified: Secondary | ICD-10-CM | POA: Diagnosis not present

## 2019-02-24 DIAGNOSIS — G20 Parkinson's disease: Secondary | ICD-10-CM | POA: Diagnosis not present

## 2019-02-24 DIAGNOSIS — R2689 Other abnormalities of gait and mobility: Secondary | ICD-10-CM | POA: Diagnosis not present

## 2019-02-25 DIAGNOSIS — M0609 Rheumatoid arthritis without rheumatoid factor, multiple sites: Secondary | ICD-10-CM | POA: Diagnosis not present

## 2019-03-25 DIAGNOSIS — M0609 Rheumatoid arthritis without rheumatoid factor, multiple sites: Secondary | ICD-10-CM | POA: Diagnosis not present

## 2019-03-29 ENCOUNTER — Other Ambulatory Visit: Payer: Self-pay | Admitting: *Deleted

## 2019-03-29 MED ORDER — ALBUTEROL SULFATE HFA 108 (90 BASE) MCG/ACT IN AERS: 2.0000 | INHALATION_SPRAY | Freq: Four times a day (QID) | RESPIRATORY_TRACT | 3 refills | Status: AC | PRN

## 2019-04-12 DIAGNOSIS — J309 Allergic rhinitis, unspecified: Secondary | ICD-10-CM | POA: Diagnosis not present

## 2019-04-12 DIAGNOSIS — E785 Hyperlipidemia, unspecified: Secondary | ICD-10-CM | POA: Diagnosis not present

## 2019-04-12 DIAGNOSIS — I4891 Unspecified atrial fibrillation: Secondary | ICD-10-CM | POA: Diagnosis not present

## 2019-04-12 DIAGNOSIS — I10 Essential (primary) hypertension: Secondary | ICD-10-CM | POA: Diagnosis not present

## 2019-04-21 DIAGNOSIS — M109 Gout, unspecified: Secondary | ICD-10-CM | POA: Diagnosis not present

## 2019-04-21 DIAGNOSIS — M0609 Rheumatoid arthritis without rheumatoid factor, multiple sites: Secondary | ICD-10-CM | POA: Diagnosis not present

## 2019-04-21 DIAGNOSIS — M25473 Effusion, unspecified ankle: Secondary | ICD-10-CM | POA: Diagnosis not present

## 2019-04-21 DIAGNOSIS — M549 Dorsalgia, unspecified: Secondary | ICD-10-CM | POA: Diagnosis not present

## 2019-04-21 DIAGNOSIS — M25579 Pain in unspecified ankle and joints of unspecified foot: Secondary | ICD-10-CM | POA: Diagnosis not present

## 2019-04-21 DIAGNOSIS — Z79899 Other long term (current) drug therapy: Secondary | ICD-10-CM | POA: Diagnosis not present

## 2019-04-21 DIAGNOSIS — M1990 Unspecified osteoarthritis, unspecified site: Secondary | ICD-10-CM | POA: Diagnosis not present

## 2019-04-21 DIAGNOSIS — M79643 Pain in unspecified hand: Secondary | ICD-10-CM | POA: Diagnosis not present

## 2019-04-21 DIAGNOSIS — Z13820 Encounter for screening for osteoporosis: Secondary | ICD-10-CM | POA: Diagnosis not present

## 2019-04-22 DIAGNOSIS — M0609 Rheumatoid arthritis without rheumatoid factor, multiple sites: Secondary | ICD-10-CM | POA: Diagnosis not present

## 2019-05-20 DIAGNOSIS — M0609 Rheumatoid arthritis without rheumatoid factor, multiple sites: Secondary | ICD-10-CM | POA: Diagnosis not present

## 2019-06-17 DIAGNOSIS — M0609 Rheumatoid arthritis without rheumatoid factor, multiple sites: Secondary | ICD-10-CM | POA: Diagnosis not present

## 2019-07-23 DIAGNOSIS — M1990 Unspecified osteoarthritis, unspecified site: Secondary | ICD-10-CM | POA: Diagnosis not present

## 2019-07-23 DIAGNOSIS — Z13820 Encounter for screening for osteoporosis: Secondary | ICD-10-CM | POA: Diagnosis not present

## 2019-07-23 DIAGNOSIS — M0609 Rheumatoid arthritis without rheumatoid factor, multiple sites: Secondary | ICD-10-CM | POA: Diagnosis not present

## 2019-07-23 DIAGNOSIS — M549 Dorsalgia, unspecified: Secondary | ICD-10-CM | POA: Diagnosis not present

## 2019-07-23 DIAGNOSIS — M25473 Effusion, unspecified ankle: Secondary | ICD-10-CM | POA: Diagnosis not present

## 2019-07-23 DIAGNOSIS — Z79899 Other long term (current) drug therapy: Secondary | ICD-10-CM | POA: Diagnosis not present

## 2019-07-23 DIAGNOSIS — M109 Gout, unspecified: Secondary | ICD-10-CM | POA: Diagnosis not present

## 2019-07-23 DIAGNOSIS — M25579 Pain in unspecified ankle and joints of unspecified foot: Secondary | ICD-10-CM | POA: Diagnosis not present

## 2019-07-23 DIAGNOSIS — M79643 Pain in unspecified hand: Secondary | ICD-10-CM | POA: Diagnosis not present

## 2019-07-26 DIAGNOSIS — I4891 Unspecified atrial fibrillation: Secondary | ICD-10-CM | POA: Diagnosis not present

## 2019-07-26 DIAGNOSIS — R739 Hyperglycemia, unspecified: Secondary | ICD-10-CM | POA: Diagnosis not present

## 2019-07-26 DIAGNOSIS — M329 Systemic lupus erythematosus, unspecified: Secondary | ICD-10-CM | POA: Diagnosis not present

## 2019-07-26 DIAGNOSIS — R5383 Other fatigue: Secondary | ICD-10-CM | POA: Diagnosis not present

## 2019-07-26 DIAGNOSIS — J449 Chronic obstructive pulmonary disease, unspecified: Secondary | ICD-10-CM | POA: Diagnosis not present

## 2019-07-26 DIAGNOSIS — Z79899 Other long term (current) drug therapy: Secondary | ICD-10-CM | POA: Diagnosis not present

## 2019-07-26 DIAGNOSIS — R600 Localized edema: Secondary | ICD-10-CM | POA: Diagnosis not present

## 2019-07-26 DIAGNOSIS — E7800 Pure hypercholesterolemia, unspecified: Secondary | ICD-10-CM | POA: Diagnosis not present

## 2019-07-26 DIAGNOSIS — M069 Rheumatoid arthritis, unspecified: Secondary | ICD-10-CM | POA: Diagnosis not present

## 2019-07-26 DIAGNOSIS — G20 Parkinson's disease: Secondary | ICD-10-CM | POA: Diagnosis not present

## 2019-07-26 DIAGNOSIS — F329 Major depressive disorder, single episode, unspecified: Secondary | ICD-10-CM | POA: Diagnosis not present

## 2019-07-26 DIAGNOSIS — K219 Gastro-esophageal reflux disease without esophagitis: Secondary | ICD-10-CM | POA: Diagnosis not present

## 2019-07-26 DIAGNOSIS — F419 Anxiety disorder, unspecified: Secondary | ICD-10-CM | POA: Diagnosis not present

## 2019-08-18 DIAGNOSIS — I480 Paroxysmal atrial fibrillation: Secondary | ICD-10-CM | POA: Diagnosis not present

## 2019-08-18 DIAGNOSIS — E02 Subclinical iodine-deficiency hypothyroidism: Secondary | ICD-10-CM | POA: Diagnosis not present

## 2019-08-18 DIAGNOSIS — Z86711 Personal history of pulmonary embolism: Secondary | ICD-10-CM | POA: Diagnosis not present

## 2019-08-18 DIAGNOSIS — Z8709 Personal history of other diseases of the respiratory system: Secondary | ICD-10-CM | POA: Diagnosis not present

## 2019-08-18 DIAGNOSIS — I10 Essential (primary) hypertension: Secondary | ICD-10-CM | POA: Diagnosis not present

## 2019-08-18 DIAGNOSIS — G20 Parkinson's disease: Secondary | ICD-10-CM | POA: Diagnosis not present

## 2019-08-20 DIAGNOSIS — M0609 Rheumatoid arthritis without rheumatoid factor, multiple sites: Secondary | ICD-10-CM | POA: Diagnosis not present

## 2019-09-17 DIAGNOSIS — M0609 Rheumatoid arthritis without rheumatoid factor, multiple sites: Secondary | ICD-10-CM | POA: Diagnosis not present

## 2019-09-21 DIAGNOSIS — R251 Tremor, unspecified: Secondary | ICD-10-CM | POA: Diagnosis not present

## 2019-09-21 DIAGNOSIS — G20 Parkinson's disease: Secondary | ICD-10-CM | POA: Diagnosis not present

## 2019-10-18 DIAGNOSIS — M0609 Rheumatoid arthritis without rheumatoid factor, multiple sites: Secondary | ICD-10-CM | POA: Diagnosis not present

## 2019-11-08 DIAGNOSIS — G20 Parkinson's disease: Secondary | ICD-10-CM | POA: Diagnosis not present

## 2019-11-08 DIAGNOSIS — Z0000 Encounter for general adult medical examination without abnormal findings: Secondary | ICD-10-CM | POA: Diagnosis not present

## 2019-11-08 DIAGNOSIS — E038 Other specified hypothyroidism: Secondary | ICD-10-CM | POA: Diagnosis not present

## 2019-11-08 DIAGNOSIS — Z23 Encounter for immunization: Secondary | ICD-10-CM | POA: Diagnosis not present

## 2019-11-08 DIAGNOSIS — Z6827 Body mass index (BMI) 27.0-27.9, adult: Secondary | ICD-10-CM | POA: Diagnosis not present

## 2019-11-08 DIAGNOSIS — R600 Localized edema: Secondary | ICD-10-CM | POA: Diagnosis not present

## 2019-11-08 DIAGNOSIS — E063 Autoimmune thyroiditis: Secondary | ICD-10-CM | POA: Diagnosis not present

## 2019-11-08 DIAGNOSIS — Z8673 Personal history of transient ischemic attack (TIA), and cerebral infarction without residual deficits: Secondary | ICD-10-CM | POA: Diagnosis not present

## 2019-11-09 DIAGNOSIS — M150 Primary generalized (osteo)arthritis: Secondary | ICD-10-CM | POA: Diagnosis not present

## 2019-11-09 DIAGNOSIS — Z79899 Other long term (current) drug therapy: Secondary | ICD-10-CM | POA: Diagnosis not present

## 2019-11-09 DIAGNOSIS — Z111 Encounter for screening for respiratory tuberculosis: Secondary | ICD-10-CM | POA: Diagnosis not present

## 2019-11-09 DIAGNOSIS — G8929 Other chronic pain: Secondary | ICD-10-CM | POA: Diagnosis not present

## 2019-11-09 DIAGNOSIS — M545 Low back pain: Secondary | ICD-10-CM | POA: Diagnosis not present

## 2019-11-09 DIAGNOSIS — M7061 Trochanteric bursitis, right hip: Secondary | ICD-10-CM | POA: Diagnosis not present

## 2019-11-09 DIAGNOSIS — M069 Rheumatoid arthritis, unspecified: Secondary | ICD-10-CM | POA: Diagnosis not present

## 2019-11-09 DIAGNOSIS — I4891 Unspecified atrial fibrillation: Secondary | ICD-10-CM | POA: Diagnosis not present

## 2019-11-09 DIAGNOSIS — M0609 Rheumatoid arthritis without rheumatoid factor, multiple sites: Secondary | ICD-10-CM | POA: Diagnosis not present

## 2019-11-09 DIAGNOSIS — M1A09X0 Idiopathic chronic gout, multiple sites, without tophus (tophi): Secondary | ICD-10-CM | POA: Diagnosis not present

## 2019-11-11 DIAGNOSIS — G20 Parkinson's disease: Secondary | ICD-10-CM | POA: Diagnosis not present

## 2019-11-11 DIAGNOSIS — M069 Rheumatoid arthritis, unspecified: Secondary | ICD-10-CM | POA: Diagnosis not present

## 2019-11-11 DIAGNOSIS — F341 Dysthymic disorder: Secondary | ICD-10-CM | POA: Diagnosis not present

## 2019-11-11 DIAGNOSIS — I4891 Unspecified atrial fibrillation: Secondary | ICD-10-CM | POA: Diagnosis not present

## 2019-11-25 DIAGNOSIS — M0609 Rheumatoid arthritis without rheumatoid factor, multiple sites: Secondary | ICD-10-CM | POA: Diagnosis not present

## 2019-12-14 DIAGNOSIS — H35371 Puckering of macula, right eye: Secondary | ICD-10-CM | POA: Diagnosis not present

## 2019-12-14 DIAGNOSIS — H52223 Regular astigmatism, bilateral: Secondary | ICD-10-CM | POA: Diagnosis not present

## 2019-12-14 DIAGNOSIS — H524 Presbyopia: Secondary | ICD-10-CM | POA: Diagnosis not present

## 2020-01-03 DIAGNOSIS — M0609 Rheumatoid arthritis without rheumatoid factor, multiple sites: Secondary | ICD-10-CM | POA: Diagnosis not present

## 2020-01-19 DIAGNOSIS — Z79899 Other long term (current) drug therapy: Secondary | ICD-10-CM | POA: Diagnosis not present

## 2020-01-19 DIAGNOSIS — M1A09X0 Idiopathic chronic gout, multiple sites, without tophus (tophi): Secondary | ICD-10-CM | POA: Diagnosis not present

## 2020-01-19 DIAGNOSIS — M150 Primary generalized (osteo)arthritis: Secondary | ICD-10-CM | POA: Diagnosis not present

## 2020-01-19 DIAGNOSIS — M545 Low back pain: Secondary | ICD-10-CM | POA: Diagnosis not present

## 2020-01-19 DIAGNOSIS — M8589 Other specified disorders of bone density and structure, multiple sites: Secondary | ICD-10-CM | POA: Diagnosis not present

## 2020-01-19 DIAGNOSIS — M0609 Rheumatoid arthritis without rheumatoid factor, multiple sites: Secondary | ICD-10-CM | POA: Diagnosis not present

## 2020-01-19 DIAGNOSIS — G8929 Other chronic pain: Secondary | ICD-10-CM | POA: Diagnosis not present

## 2020-01-19 DIAGNOSIS — I4891 Unspecified atrial fibrillation: Secondary | ICD-10-CM | POA: Diagnosis not present

## 2020-01-26 DIAGNOSIS — Z20822 Contact with and (suspected) exposure to covid-19: Secondary | ICD-10-CM | POA: Diagnosis not present

## 2020-01-31 DIAGNOSIS — Z79899 Other long term (current) drug therapy: Secondary | ICD-10-CM | POA: Diagnosis not present

## 2020-01-31 DIAGNOSIS — M0689 Other specified rheumatoid arthritis, multiple sites: Secondary | ICD-10-CM | POA: Diagnosis not present

## 2020-01-31 DIAGNOSIS — M0609 Rheumatoid arthritis without rheumatoid factor, multiple sites: Secondary | ICD-10-CM | POA: Diagnosis not present

## 2020-02-08 DIAGNOSIS — E038 Other specified hypothyroidism: Secondary | ICD-10-CM | POA: Diagnosis not present

## 2020-02-08 DIAGNOSIS — F419 Anxiety disorder, unspecified: Secondary | ICD-10-CM | POA: Diagnosis not present

## 2020-02-08 DIAGNOSIS — I482 Chronic atrial fibrillation, unspecified: Secondary | ICD-10-CM | POA: Diagnosis not present

## 2020-02-08 DIAGNOSIS — L309 Dermatitis, unspecified: Secondary | ICD-10-CM | POA: Diagnosis not present

## 2020-02-09 DIAGNOSIS — M810 Age-related osteoporosis without current pathological fracture: Secondary | ICD-10-CM | POA: Diagnosis not present

## 2020-02-28 DIAGNOSIS — M0609 Rheumatoid arthritis without rheumatoid factor, multiple sites: Secondary | ICD-10-CM | POA: Diagnosis not present

## 2020-03-03 DIAGNOSIS — L858 Other specified epidermal thickening: Secondary | ICD-10-CM | POA: Diagnosis not present

## 2020-03-03 DIAGNOSIS — L578 Other skin changes due to chronic exposure to nonionizing radiation: Secondary | ICD-10-CM | POA: Diagnosis not present

## 2020-03-03 DIAGNOSIS — L853 Xerosis cutis: Secondary | ICD-10-CM | POA: Diagnosis not present

## 2020-03-03 DIAGNOSIS — D492 Neoplasm of unspecified behavior of bone, soft tissue, and skin: Secondary | ICD-10-CM | POA: Diagnosis not present

## 2020-03-03 DIAGNOSIS — L7211 Pilar cyst: Secondary | ICD-10-CM | POA: Diagnosis not present

## 2020-03-03 DIAGNOSIS — L814 Other melanin hyperpigmentation: Secondary | ICD-10-CM | POA: Diagnosis not present

## 2020-03-03 DIAGNOSIS — D225 Melanocytic nevi of trunk: Secondary | ICD-10-CM | POA: Diagnosis not present

## 2020-03-27 DIAGNOSIS — M0609 Rheumatoid arthritis without rheumatoid factor, multiple sites: Secondary | ICD-10-CM | POA: Diagnosis not present
